# Patient Record
Sex: Female | Born: 1985 | Race: White | Hispanic: No | Marital: Married | Smoking: Never smoker
Health system: Southern US, Community
[De-identification: ages and names within clinical notes are randomized; demographics above are authoritative.]

## PROBLEM LIST (undated history)

## (undated) DIAGNOSIS — R519 Headache, unspecified: Secondary | ICD-10-CM

## (undated) DIAGNOSIS — R51 Headache: Secondary | ICD-10-CM

## (undated) DIAGNOSIS — F329 Major depressive disorder, single episode, unspecified: Secondary | ICD-10-CM

## (undated) DIAGNOSIS — F419 Anxiety disorder, unspecified: Secondary | ICD-10-CM

## (undated) DIAGNOSIS — T4145XA Adverse effect of unspecified anesthetic, initial encounter: Secondary | ICD-10-CM

## (undated) DIAGNOSIS — T8859XA Other complications of anesthesia, initial encounter: Secondary | ICD-10-CM

## (undated) DIAGNOSIS — F32A Depression, unspecified: Secondary | ICD-10-CM

## (undated) HISTORY — PX: WISDOM TOOTH EXTRACTION: SHX21

---

## 2009-05-15 HISTORY — PX: APPENDECTOMY: SHX54

## 2013-06-25 ENCOUNTER — Encounter (HOSPITAL_COMMUNITY): Payer: Self-pay | Admitting: *Deleted

## 2013-06-25 ENCOUNTER — Inpatient Hospital Stay (HOSPITAL_COMMUNITY)
Admission: AD | Admit: 2013-06-25 | Discharge: 2013-06-28 | DRG: 759 | Disposition: A | Discharge status: Home / Self Care | Payer: BC Managed Care – PPO | Source: Ambulatory Visit | Attending: Obstetrics and Gynecology | Admitting: Obstetrics and Gynecology

## 2013-06-25 ENCOUNTER — Other Ambulatory Visit: Payer: Self-pay | Admitting: Obstetrics and Gynecology

## 2013-06-25 ENCOUNTER — Encounter (HOSPITAL_COMMUNITY): Payer: Self-pay | Admitting: Anesthesiology

## 2013-06-25 ENCOUNTER — Ambulatory Visit (HOSPITAL_COMMUNITY)
Admission: RE | Admit: 2013-06-25 | Discharge: 2013-06-25 | Disposition: A | Discharge status: Home / Self Care | Payer: BC Managed Care – PPO | Source: Ambulatory Visit | Attending: Obstetrics and Gynecology | Admitting: Obstetrics and Gynecology

## 2013-06-25 DIAGNOSIS — R112 Nausea with vomiting, unspecified: Secondary | ICD-10-CM | POA: Diagnosis present

## 2013-06-25 DIAGNOSIS — N83519 Torsion of ovary and ovarian pedicle, unspecified side: Secondary | ICD-10-CM

## 2013-06-25 DIAGNOSIS — R1032 Left lower quadrant pain: Secondary | ICD-10-CM | POA: Diagnosis present

## 2013-06-25 DIAGNOSIS — N839 Noninflammatory disorder of ovary, fallopian tube and broad ligament, unspecified: Secondary | ICD-10-CM | POA: Diagnosis present

## 2013-06-25 DIAGNOSIS — D7289 Other specified disorders of white blood cells: Secondary | ICD-10-CM | POA: Diagnosis present

## 2013-06-25 DIAGNOSIS — N7093 Salpingitis and oophoritis, unspecified: Principal | ICD-10-CM | POA: Diagnosis present

## 2013-06-25 HISTORY — DX: Major depressive disorder, single episode, unspecified: F32.9

## 2013-06-25 HISTORY — DX: Depression, unspecified: F32.A

## 2013-06-25 HISTORY — DX: Anxiety disorder, unspecified: F41.9

## 2013-06-25 MED ORDER — GENTAMICIN SULFATE 40 MG/ML IJ SOLN
Freq: Once | INTRAMUSCULAR | Status: AC
  Administered 2013-06-25: 23:00:00 via INTRAVENOUS
  Filled 2013-06-25: qty 4

## 2013-06-25 MED ORDER — GENTAMICIN SULFATE 40 MG/ML IJ SOLN: Freq: Three times a day (TID) | INTRAVENOUS | Status: DC

## 2013-06-25 MED ORDER — HYDROMORPHONE HCL PF 1 MG/ML IJ SOLN
1.0000 mg | INTRAMUSCULAR | Status: DC | PRN
  Administered 2013-06-25 – 2013-06-26 (×4): 1 mg via INTRAVENOUS
  Filled 2013-06-25 (×4): qty 1

## 2013-06-25 MED ORDER — ONDANSETRON HCL 4 MG/2ML IJ SOLN
4.0000 mg | Freq: Once | INTRAMUSCULAR | Status: AC
  Administered 2013-06-25: 4 mg via INTRAVENOUS
  Filled 2013-06-25: qty 2

## 2013-06-25 MED ORDER — GENTAMICIN SULFATE 40 MG/ML IJ SOLN
Freq: Three times a day (TID) | INTRAVENOUS | Status: DC
  Administered 2013-06-26 – 2013-06-28 (×7): via INTRAVENOUS
  Filled 2013-06-25 (×9): qty 4

## 2013-06-25 MED ORDER — CLINDAMYCIN PHOSPHATE 900 MG/50ML IV SOLN
900.0000 mg | Freq: Three times a day (TID) | INTRAVENOUS | Status: DC
  Filled 2013-06-25: qty 50

## 2013-06-25 MED ORDER — HYDROMORPHONE HCL PF 1 MG/ML IJ SOLN
2.0000 mg | Freq: Once | INTRAMUSCULAR | Status: AC
Start: 2013-06-25 — End: 2013-06-25
  Administered 2013-06-25: 2 mg via INTRAVENOUS
  Filled 2013-06-25: qty 2

## 2013-06-25 MED ORDER — LACTATED RINGERS IV SOLN
INTRAVENOUS | Status: DC
  Administered 2013-06-25 – 2013-06-27 (×3): via INTRAVENOUS

## 2013-06-25 MED ORDER — FLUOXETINE HCL 20 MG PO TABS
20.0000 mg | ORAL_TABLET | Freq: Every day | ORAL | Status: DC
  Administered 2013-06-26 – 2013-06-28 (×3): 20 mg via ORAL
  Filled 2013-06-25 (×4): qty 1

## 2013-06-25 MED ORDER — ONDANSETRON HCL 4 MG/2ML IJ SOLN: 4.0000 mg | Freq: Three times a day (TID) | INTRAMUSCULAR | Status: DC | PRN

## 2013-06-25 MED ORDER — LACTATED RINGERS IV SOLN
INTRAVENOUS | Status: DC
  Administered 2013-06-25 – 2013-06-26 (×2): via INTRAVENOUS

## 2013-06-25 MED ORDER — SODIUM CHLORIDE 0.9 % IV SOLN
1.0000 g | Freq: Four times a day (QID) | INTRAVENOUS | Status: DC
Start: 2013-06-26 — End: 2013-06-28
  Administered 2013-06-25 – 2013-06-28 (×10): 1 g via INTRAVENOUS
  Filled 2013-06-25 (×13): qty 1000

## 2013-06-25 NOTE — H&P (Signed)
Becky Williamson is a 28 y.o. female, sent over from the hospital d/t significant left lower pain with N/V.  U/S showed large 7 cm left hydrosalpinx and 5 cm left ovary with + blood flowe and normal right ovary.  No mention of hydrosalpinx on CT from January.  UPT was neg today.  Denies VB, recent fever, resp or GI c/o's, UTI s/s.   Patient Active Problem List   Diagnosis Date Noted  . Vitamin D deficiency 06/27/2013  . Obesity, unspecified 06/27/2013  . Migraine 06/27/2013  . Premenstrual dysphoric disorder 06/27/2013  . TOA (tubo-ovarian abscess) 06/25/2013      OB History   Grav Para Term Preterm Abortions TAB SAB Ect Mult Living                 Past Medical History  Diagnosis Date  . Anxiety   . Depression    Past Surgical History  Procedure Laterality Date  . Wisdom tooth extraction    . Appendectomy  2011   Family History: family history includes Heart disease in her father and mother; Hypertension in her father and mother. Social History:  reports that she has never smoked. She has never used smokeless tobacco. She reports that she does not drink alcohol or use illicit drugs.   ROS: see HPI above, all other systems are negative   No Known Allergies     Blood pressure 134/92, pulse 59, temperature 97.9 F (36.6 C), temperature source Oral, resp. rate 18, height '5\' 7"'  (1.702 m), weight 280 lb (127.007 kg), last menstrual period 06/09/2013, SpO2 100.00%.  Chest clear Heart RRR without murmur Abd guarding lower left abdomen Ext: WNL   Labs: UPT neg Quant < 2.0 Urinalysis neg  Lipase < 10 Amylase 25  WBC 21.0  RBC 5.10  HEMOGLOBIN 13.2  HEMATOCRIT 38.8  MCV 76.1  MCH 25.9  MCHC 34.0  RDW 16.2  PLATELET COUNT 373    GRANULOCYTE % 88  ABSOLUTE GRAN 19.1  LYMPH % 8  ABSOLUTE LYMPH 1.8  MONO % 4  ABSOLUTE MONO 1.0  EOS % 0  ABSOLUTE EOS 0.0  BASO % 0  ABSOLUTE BASO 0.0  SMEAR REVIEW Criteria for review not met  SODIUM 138  POTASSIUM 4.0   CHLORIDE 103  CO2 23  GLUCOSE 101  BUN 11  CREATININE 0.76  BILIRUBIN, TOTAL 0.3  ALKALINE PHOSPHATASE 71 AST/SGOT 15  ALT/SGPT 15  TOTAL PROTEIN 7.5  ALBUMIN 4.2  CALCIUM 9.6    Assessment/Plan: Possible TOA Left lower abdominal pain with N/V  Admit to Med/Surg per c/w Dr. Cletis Media Abx therapy - Clindamycin, Gentamycin, Ampicillin Dilaudid for pain management Zofran for nausea Repeat CBC with diff in the am   Emilee Hero, MSN 06/27/2013, 11:29 AM

## 2013-06-25 NOTE — MAU Note (Signed)
Patient states she started having left lower abdominal pain about mid morning, has gotten worse. Was seen in the office and sent for an outpatient ultrasound then to MAU for further evaluation. Patient is vomiting on arrival to room in MAU. Denies bleeding or vaginal discharge.

## 2013-06-25 NOTE — Progress Notes (Addendum)
ANTIBIOTIC CONSULT NOTE - INITIAL  Pharmacy Consult for Gentamicin Indication: TOA   No Known Allergies  Patient Measurements: Height: 5\' 7"  (170.2 cm) Weight: 280 lb (127.007 kg) IBW/kg (Calculated) : 61.6 Adjusted Body Weight: 81.2 kg  Vital Signs: Temp: 98.1 F (36.7 C) (02/11 1844) Temp src: Oral (02/11 1844) BP: 139/88 mmHg (02/11 1942) Pulse Rate: 108 (02/11 1942)  Labs: No results found for this basename: WBC, HGB, PLT, LABCREA, CREATININE, CRCLEARANCE,  in the last 72 hours No results found for this basename: GENTTROUGH, GENTPEAK, GENTRANDOM,  in the last 72 hours   Microbiology: No results found for this or any previous visit (from the past 720 hour(s)).  Medications:  Clindamycin 900 mg IV q8h, ampicillin 2g IV q6h  Assessment: 28 y.o. female sent to MAU after US today revealed possible TOA. Estimated Ke = 0.355, Vd = 24.4L  Goal of Therapy:  Gentamicin peak 6-8 mg/L and Trough < 1 mg/L  Plan:   Gentamicin 160 mg IV q8h  Check Scr with next labs if gentamicin continued. Consider gentamicin trough if treatment continues >72h, or clinically indicated.  Drusilla KannerGrimsley, Casey Fye Lydia 06/25/2013,9:54 PM

## 2013-06-26 LAB — CBC WITH DIFFERENTIAL/PLATELET
Basophils Absolute: 0 10*3/uL (ref 0.0–0.1)
Basophils Relative: 0 % (ref 0–1)
Eosinophils Absolute: 0 10*3/uL (ref 0.0–0.7)
Eosinophils Relative: 0 % (ref 0–5)
HCT: 35.8 % — ABNORMAL LOW (ref 36.0–46.0)
HEMOGLOBIN: 11.8 g/dL — AB (ref 12.0–15.0)
LYMPHS ABS: 2.8 10*3/uL (ref 0.7–4.0)
LYMPHS PCT: 22 % (ref 12–46)
MCH: 25.8 pg — ABNORMAL LOW (ref 26.0–34.0)
MCHC: 33 g/dL (ref 30.0–36.0)
MCV: 78.2 fL (ref 78.0–100.0)
MONOS PCT: 8 % (ref 3–12)
Monocytes Absolute: 1 10*3/uL (ref 0.1–1.0)
NEUTROS PCT: 70 % (ref 43–77)
Neutro Abs: 8.9 10*3/uL — ABNORMAL HIGH (ref 1.7–7.7)
PLATELETS: 303 10*3/uL (ref 150–400)
RBC: 4.58 MIL/uL (ref 3.87–5.11)
RDW: 15.2 % (ref 11.5–15.5)
WBC: 12.8 10*3/uL — AB (ref 4.0–10.5)

## 2013-06-26 LAB — CREATININE, SERUM
CREATININE: 0.73 mg/dL (ref 0.50–1.10)
GFR calc Af Amer: >90 mL/min (ref >90)

## 2013-06-26 MED ORDER — KETOROLAC TROMETHAMINE 30 MG/ML IJ SOLN
30.0000 mg | Freq: Four times a day (QID) | INTRAMUSCULAR | Status: AC
  Administered 2013-06-26 (×3): 30 mg via INTRAVENOUS
  Filled 2013-06-26 (×3): qty 1

## 2013-06-26 MED ORDER — MORPHINE SULFATE 4 MG/ML IJ SOLN
1.0000 mg | INTRAMUSCULAR | Status: DC | PRN
  Administered 2013-06-26: 4 mg via INTRAVENOUS
  Administered 2013-06-26 (×2): 1 mg via INTRAVENOUS
  Administered 2013-06-27: 2 mg via INTRAVENOUS
  Filled 2013-06-26 (×4): qty 1

## 2013-06-26 NOTE — Progress Notes (Signed)
Subjective: Patient reports no problems voiding.  Says pain medicine is not really adequate.  Still has pain but much better than yesterday.  Only feels nausea when pain gets bad.    Objective: I have reviewed patient's vital signs.  General: alert and no distress Resp: clear to auscultation bilaterally Cardio: regular rate and rhythm GI: soft, + LLQ tenderness with deep palpation, no rebound or guarding Extremities: extremities normal, atraumatic, no cyanosis or edema, no calf tenderness no CVAT   Assessment/Plan: 27yo with TOA - repeat u/s in 6-8 wks and upon d/c abx for total of 2wks Cont A/G/C Pt afebrile GC/CT not sent on admission - will send dirty urine specimen now although has been getting antibiotics D/C dilaudid Add toradol and start morphine prn Possible d/c tomorrow if continue to improve    LOS: 1 day    Becky Williamson 06/26/2013, 1:46 PM

## 2013-06-27 DIAGNOSIS — E669 Obesity, unspecified: Secondary | ICD-10-CM | POA: Insufficient documentation

## 2013-06-27 DIAGNOSIS — F3281 Premenstrual dysphoric disorder: Secondary | ICD-10-CM | POA: Insufficient documentation

## 2013-06-27 DIAGNOSIS — E559 Vitamin D deficiency, unspecified: Secondary | ICD-10-CM | POA: Insufficient documentation

## 2013-06-27 DIAGNOSIS — G43909 Migraine, unspecified, not intractable, without status migrainosus: Secondary | ICD-10-CM | POA: Insufficient documentation

## 2013-06-27 LAB — GC/CHLAMYDIA PROBE AMP
CT PROBE, AMP APTIMA: NEGATIVE
GC PROBE AMP APTIMA: NEGATIVE

## 2013-06-27 MED ORDER — OXYCODONE-ACETAMINOPHEN 5-325 MG PO TABS
1.0000 | ORAL_TABLET | ORAL | Status: DC | PRN
  Administered 2013-06-27: 1 via ORAL
  Filled 2013-06-27: qty 1

## 2013-06-27 MED ORDER — DOCUSATE SODIUM 100 MG PO CAPS
100.0000 mg | ORAL_CAPSULE | Freq: Three times a day (TID) | ORAL | Status: DC
  Administered 2013-06-27 – 2013-06-28 (×4): 100 mg via ORAL
  Filled 2013-06-27 (×4): qty 1

## 2013-06-27 MED ORDER — IBUPROFEN 600 MG PO TABS
600.0000 mg | ORAL_TABLET | Freq: Four times a day (QID) | ORAL | Status: DC
  Administered 2013-06-27 – 2013-06-28 (×5): 600 mg via ORAL
  Filled 2013-06-27 (×5): qty 1

## 2013-06-27 MED ORDER — HYDROMORPHONE HCL 2 MG PO TABS
2.0000 mg | ORAL_TABLET | ORAL | Status: DC | PRN
Start: 2013-06-27 — End: 2013-06-28

## 2013-06-27 MED ORDER — FAMOTIDINE 20 MG PO TABS
ORAL_TABLET | ORAL | Status: AC
  Filled 2013-06-27: qty 1

## 2013-06-27 NOTE — Progress Notes (Signed)
Becky HarmsMegan Williamson is a27 y.o.  [id number]  Hospital Day: #2  Subjective: Patient reports  feeling better but has pain with ambulation.  Currently rates pain as 3/10. Patient has tolerated liquids, is voiding without difficulty and passed flatus.  Objective: Vital signs in last 24 hours: Temp:  [97.9 F (36.6 C)-98.2 F (36.8 C)] 97.9 F (36.6 C) (02/13 0600) Pulse Rate:  [55-107] 59 (02/13 0600) Resp:  [14-18] 18 (02/13 0600) BP: (129-157)/(76-93) 134/92 mmHg (02/13 0600) SpO2:  [95 %-100 %] 100 % (02/13 0600)  Intake/Output from previous day: 02/12 0701 - 02/13 0700 In: 4138.2 [P.O.:462; I.V.:3626.2] Out: 2550 [Urine:2550] Intake/Output this shift:    Recent Labs Lab 06/26/13 0510  WBC 12.8*  HGB 11.8*  HCT 35.8*  PLT 303     Recent Labs Lab 06/26/13 0510  CREATININE 0.73    EXAM: General: alert, no distress and moderately obese Resp: clear to auscultation bilaterally Cardio: regular rate and rhythm, S1, S2 normal, no murmur, click, rub or gallop GI: Bowel sounds present, soft with tenderness and voluntary guarding with deep palpation-no rebound. Extremities: SCD hose in place and no calf tenderness.   Assessment: s/p : stable and progressing well Tubo-ovarian Abscess (Left)  Plan: Advance diet Encourage ambulation Advance to PO medication  LOS: 2 days    Bernestine Holsapple, PA-C 06/27/2013 7:50 AM

## 2013-06-27 NOTE — Progress Notes (Signed)
Pt seen and examined BP 134/92  Pulse 59  Temp(Src) 97.9 F (36.6 C) (Oral)  Resp 18  Ht 5\' 7"  (1.702 m)  Wt 280 lb (127.007 kg)  BMI 43.84 kg/m2  SpO2 100%  LMP 06/09/2013 Physical Examination: General appearance - alert, well appearing, and in no distress Chest - clear to auscultation, no wheezes, rales or rhonchi, symmetric air entry Heart - normal rate and regular rhythm Abdomen - soft, nontender, nondistended, no masses or organomegaly Extremities - SCDS B If pt tolerates PO pain meds and diet she can be discharged to follow up in the office

## 2013-06-28 MED ORDER — IBUPROFEN 600 MG PO TABS: 600.0000 mg | ORAL_TABLET | Freq: Four times a day (QID) | ORAL | Status: DC

## 2013-06-28 MED ORDER — DOXYCYCLINE HYCLATE 50 MG PO CAPS: 100.0000 mg | ORAL_CAPSULE | Freq: Two times a day (BID) | ORAL | Status: AC

## 2013-06-28 MED ORDER — DSS 100 MG PO CAPS: 100.0000 mg | ORAL_CAPSULE | Freq: Three times a day (TID) | ORAL | Status: DC

## 2013-06-28 MED ORDER — OXYCODONE-ACETAMINOPHEN 5-325 MG PO TABS: 1.0000 | ORAL_TABLET | ORAL | Status: DC | PRN

## 2013-06-28 NOTE — Discharge Instructions (Signed)

## 2013-06-28 NOTE — Progress Notes (Signed)
Discharge instructions reviewed with patient.  Patient states understanding of home care, medications, activity, signs/symptoms to report to MD and return MD office visit.  No home equipment needed.  Patient ambulated for discharge in stable condition with staff without incident.  

## 2013-06-28 NOTE — Discharge Summary (Signed)
Physician Discharge Summary  Patient ID: Becky Williamson MRN: [id number] DOB/AGE: 1986-01-02 28 y.o.  Admit date: 06/25/2013 Discharge date: 06/28/2013  Admission Diagnoses: TOA abdominal pain, n/v  Discharge Diagnoses: TOA Active Problems:   TOA (tubo-ovarian abscess)   Discharged Condition: good  Hospital Course: Pt presented with n/v and elevated WBC and 5 cm ovarian mass.  She was given IVF, abx and pain medicine.  Her pain improved and her WBC normalized.  She was able to tolerate a regular diet.    Consults: None  Significant Diagnostic Studies: see labs  Treatments: IV hydration, antibiotics: gentamycin and ampicillin and clindamcin and analgesia: acetaminophen w/ codeine and Dilaudid  Discharge Exam: Blood pressure 144/87, pulse 79, temperature 98 F (36.7 C), temperature source Oral, resp. rate 18, height 5\' 7"  (1.702 m), weight 280 lb (127.007 kg), last menstrual period 06/09/2013, SpO2 100.00%. General appearance: alert and cooperative Chest wall: no tenderness Cardio: regular rate and rhythm GI: soft, non-tender; bowel sounds normal; no masses,  no organomegaly Extremities: Homans sign is negative, no sign of DVT  Disposition: Final discharge disposition not confirmed  Discharge Orders   Future Orders Complete By Expires   Call MD for:  persistant dizziness or light-headedness  As directed    Call MD for:  severe uncontrolled pain  As directed    Call MD for:  temperature >100.4  As directed    Diet general  As directed    Discharge instructions  As directed    Comments:     Call for vaginal bleeding if you soak greater than pad per hour   Increase activity slowly  As directed    May shower / Bathe  As directed    Sexual Activity Restrictions  As directed    Comments:     Pelvic rest.  Nothing in the vagina       Medication List    STOP taking these medications       bismuth subsalicylate 262 MG/15ML suspension  Commonly known as:  PEPTO BISMOL     EXCEDRIN PO      TAKE these medications       doxycycline 50 MG capsule  Commonly known as:  VIBRAMYCIN  Take 2 capsules (100 mg total) by mouth 2 (two) times daily.     DSS 100 MG Caps  Take 100 mg by mouth 3 (three) times daily.     ergocalciferol 50000 UNITS capsule  Commonly known as:  VITAMIN D2  Take 50,000 Units by mouth once a week.     FLUoxetine 20 MG tablet  Commonly known as:  PROZAC  Take 20 mg by mouth daily.     ibuprofen 600 MG tablet  Commonly known as:  ADVIL,MOTRIN  Take 1 tablet (600 mg total) by mouth every 6 (six) hours.     oxyCODONE-acetaminophen 5-325 MG per tablet  Commonly known as:  PERCOCET/ROXICET  Take 1-2 tablets by mouth every 4 (four) hours as needed for moderate pain.           Follow-up Information   Follow up with Purcell NailsOBERTS,ANGELA Y, MD. Schedule an appointment as soon as possible for a visit in 6 weeks.   Specialty:  Obstetrics and Gynecology   Contact information:   [address] [place] [address] [phone]       Signed: Jaymes GraffDILLARD,Mouhamad Teed A 06/28/2013, 12:54 PM

## 2014-09-22 ENCOUNTER — Ambulatory Visit: Payer: Self-pay | Admitting: Physician Assistant

## 2014-09-29 ENCOUNTER — Encounter (HOSPITAL_COMMUNITY): Payer: Self-pay | Admitting: General Practice

## 2014-10-02 ENCOUNTER — Other Ambulatory Visit: Payer: Self-pay | Admitting: Obstetrics and Gynecology

## 2014-10-13 ENCOUNTER — Other Ambulatory Visit (HOSPITAL_COMMUNITY): Payer: Self-pay | Admitting: Obstetrics and Gynecology

## 2014-10-13 NOTE — H&P (Signed)
29 yo female G0 presents for  laparoscopy because of pelvic pain and for  evacuation of  a left hydrosalpinx.   The patient was hospitalized a year ago because of a tuba-ovarian abscess.  At the time of hospitalization the left tube had enlarged to 7 cm.  Four Serial ultrasounds were performed with the most recent being May 2016: uterus measured: 5.25 x 3.65 x 3.60 cm, left ovary:  4.48 x 3.83 x 2.86 cm and right ovary: 3.23 x 2.65 x 1.7 cm with a persistent left hydrosalpinx: 1.5 x 3.4 x 4.9 cm.  Since last year the patient has had minimal discomfort from her left hydrosalpinx however, in recent months she will experience debilitating pain for one week when she ovulates. Menstrual flow is monthly, lasting 5-7 days with the need to change a menstrual cup every 12 hours.  Her menstrual cramping, accompanied by nausea a dizziness,  is rated as a 10/10 on a 10 point pain scale but is relieved with Ibuprofen 400 mg and activity.  She denies any changes in bowel or bladder function but admits to some discomfort with intercourse.  A review of both medical, surgical and expectant management options were given to the patient for her condition, however,  she desires to have both of her tubes removed since she and her spouse do not plan to have children.  Advised patient that 50% of patients who decide on tubal sterilization before age 110 regret the decision, however, she is firm.  Past Medical History  OB History: G: 0  GYN History: menarche: 29 YO    LMP: 09/27/2014    Contracepton condoms  The patient denies history of sexually transmitted disease.  Denies history of abnormal PAP smear  Last PAP smear: 2015  Medical History: Anxiety, PMDD,  Vitamin D Deficiency and Migraine  Surgical History: 2011  Appendectomy Denies problems with anesthesia or history of blood transfusions  Family History: Heart Disease, Hypertension,  Stroke and Blood Clots  Social History: Married  and denies tobacco use and occasionally  uses alcohol  Medications:  Fluoxetine 20 mg  daily Ibuprofen 600 mg  every 6 hours prn   No Known Allergies  ROS: Admits to glasses/contact lenses;  Denies headache, vision changes, nasal congestion, dysphagia, tinnitus, dizziness, hoarseness, cough,  chest pain, shortness of breath, nausea, vomiting, diarrhea,constipation,  urinary frequency, urgency  dysuria, hematuria, vaginitis symptoms,  swelling of joints,easy bruising,  myalgias, arthralgias, skin rashes, unexplained weight loss and except as is mentioned in the history of present illness, patient's review of systems is otherwise negative.  Physical Exam  Bp: 142/80    P: 80  R: 20  Temperature: 98.5 degrees F orally  Weight: 200.5 lbs.  Height:   BMI: 46.9  Neck: supple without masses or thyromegaly Lungs: clear to auscultation Heart: regular rate and rhythm Abdomen: soft, non-tender and no organomegaly Pelvic:EGBUS- wnl; vagina-normal rugae; uterus-normal size, cervix without lesions or motion tenderness; adnexae-no tenderness or masses Extremities:  no clubbing, cyanosis or edema   Assesment: Pelvic Pain            Left Hydrosalpinx   Disposition:  A discussion was held with patient regarding the indication for her procedure(s) along with the risks, which include but are not limited to: reaction to anesthesia, damage to adjacent organs, infection and excessive bleeding. The patient verbalized understanding of these risk and has consented to proceed with Laparoscopic Left Salpingectomy with Possible Bilateral Salpingectomy at [facility] [place] on November 05, 2014.   CSN# [id number]   Ashaki Frosch J. Lowell GuitarPowell, PA-C  for Dr. Woodroe ModeAngela Y. Su Hiltoberts

## 2014-11-04 MED ORDER — DEXTROSE 5 % IV SOLN
3.0000 g | INTRAVENOUS | Status: AC
  Administered 2014-11-05: 3 g via INTRAVENOUS
  Filled 2014-11-04: qty 3000

## 2014-11-05 ENCOUNTER — Ambulatory Visit (HOSPITAL_COMMUNITY): Payer: BLUE CROSS/BLUE SHIELD | Admitting: Anesthesiology

## 2014-11-05 ENCOUNTER — Encounter (HOSPITAL_COMMUNITY)
Admission: RE | Disposition: A | Discharge status: Home / Self Care | Payer: Self-pay | Source: Ambulatory Visit | Attending: Obstetrics and Gynecology

## 2014-11-05 ENCOUNTER — Ambulatory Visit (HOSPITAL_COMMUNITY)
Admission: RE | Admit: 2014-11-05 | Discharge: 2014-11-05 | Disposition: A | Discharge status: Home / Self Care | Payer: BLUE CROSS/BLUE SHIELD | Source: Ambulatory Visit | Attending: Obstetrics and Gynecology | Admitting: Obstetrics and Gynecology

## 2014-11-05 ENCOUNTER — Encounter (HOSPITAL_COMMUNITY): Payer: Self-pay | Admitting: *Deleted

## 2014-11-05 DIAGNOSIS — R102 Pelvic and perineal pain: Secondary | ICD-10-CM | POA: Diagnosis not present

## 2014-11-05 DIAGNOSIS — E559 Vitamin D deficiency, unspecified: Secondary | ICD-10-CM | POA: Insufficient documentation

## 2014-11-05 DIAGNOSIS — N943 Premenstrual tension syndrome: Secondary | ICD-10-CM | POA: Insufficient documentation

## 2014-11-05 DIAGNOSIS — N7011 Chronic salpingitis: Secondary | ICD-10-CM

## 2014-11-05 DIAGNOSIS — Z6841 Body Mass Index (BMI) 40.0 and over, adult: Secondary | ICD-10-CM | POA: Diagnosis not present

## 2014-11-05 DIAGNOSIS — N831 Corpus luteum cyst: Secondary | ICD-10-CM | POA: Insufficient documentation

## 2014-11-05 DIAGNOSIS — G43909 Migraine, unspecified, not intractable, without status migrainosus: Secondary | ICD-10-CM | POA: Diagnosis not present

## 2014-11-05 DIAGNOSIS — F419 Anxiety disorder, unspecified: Secondary | ICD-10-CM | POA: Diagnosis not present

## 2014-11-05 DIAGNOSIS — N7093 Salpingitis and oophoritis, unspecified: Secondary | ICD-10-CM

## 2014-11-05 HISTORY — PX: LAPAROSCOPIC BILATERAL SALPINGECTOMY: SHX5889

## 2014-11-05 LAB — PREGNANCY, URINE: PREG TEST UR: NEGATIVE

## 2014-11-05 LAB — CBC
HCT: 37.8 % (ref 36.0–46.0)
Hemoglobin: 12.8 g/dL (ref 12.0–15.0)
MCH: 26.6 pg (ref 26.0–34.0)
MCHC: 33.9 g/dL (ref 30.0–36.0)
MCV: 78.6 fL (ref 78.0–100.0)
PLATELETS: 332 10*3/uL (ref 150–400)
RBC: 4.81 MIL/uL (ref 3.87–5.11)
RDW: 15.7 % — AB (ref 11.5–15.5)
WBC: 15.9 10*3/uL — ABNORMAL HIGH (ref 4.0–10.5)

## 2014-11-05 SURGERY — SALPINGECTOMY, BILATERAL, LAPAROSCOPIC
Anesthesia: General | Site: Abdomen | Laterality: Bilateral

## 2014-11-05 MED ORDER — BUPIVACAINE HCL (PF) 0.25 % IJ SOLN
INTRAMUSCULAR | Status: AC
  Filled 2014-11-05: qty 30

## 2014-11-05 MED ORDER — KETOROLAC TROMETHAMINE 30 MG/ML IJ SOLN
30.0000 mg | Freq: Once | INTRAMUSCULAR | Status: AC | PRN
  Administered 2014-11-05: 30 mg via INTRAVENOUS

## 2014-11-05 MED ORDER — FENTANYL CITRATE (PF) 250 MCG/5ML IJ SOLN
INTRAMUSCULAR | Status: AC
  Filled 2014-11-05: qty 5

## 2014-11-05 MED ORDER — FENTANYL CITRATE (PF) 100 MCG/2ML IJ SOLN
INTRAMUSCULAR | Status: AC
  Administered 2014-11-05: 25 ug via INTRAVENOUS
  Filled 2014-11-05: qty 2

## 2014-11-05 MED ORDER — HYDROMORPHONE HCL 1 MG/ML IJ SOLN
INTRAMUSCULAR | Status: AC
  Filled 2014-11-05: qty 1

## 2014-11-05 MED ORDER — PROPOFOL 10 MG/ML IV BOLUS
INTRAVENOUS | Status: DC | PRN
  Administered 2014-11-05: 50 mg via INTRAVENOUS
  Administered 2014-11-05: 150 mg via INTRAVENOUS

## 2014-11-05 MED ORDER — MEPERIDINE HCL 25 MG/ML IJ SOLN: 6.2500 mg | INTRAMUSCULAR | Status: DC | PRN

## 2014-11-05 MED ORDER — ONDANSETRON HCL 4 MG/2ML IJ SOLN
4.0000 mg | Freq: Once | INTRAMUSCULAR | Status: DC | PRN
Start: 2014-11-05 — End: 2014-11-05

## 2014-11-05 MED ORDER — BUPIVACAINE HCL (PF) 0.25 % IJ SOLN
INTRAMUSCULAR | Status: DC | PRN
  Administered 2014-11-05: 20 mL

## 2014-11-05 MED ORDER — SCOPOLAMINE 1 MG/3DAYS TD PT72
1.0000 | MEDICATED_PATCH | Freq: Once | TRANSDERMAL | Status: DC
  Administered 2014-11-05: 1.5 mg via TRANSDERMAL

## 2014-11-05 MED ORDER — ROCURONIUM BROMIDE 100 MG/10ML IV SOLN
INTRAVENOUS | Status: DC | PRN
  Administered 2014-11-05: 20 mg via INTRAVENOUS
  Administered 2014-11-05 (×2): 10 mg via INTRAVENOUS
  Administered 2014-11-05: 50 mg via INTRAVENOUS
  Administered 2014-11-05: 10 mg via INTRAVENOUS

## 2014-11-05 MED ORDER — KETOROLAC TROMETHAMINE 30 MG/ML IJ SOLN
INTRAMUSCULAR | Status: AC
  Administered 2014-11-05: 30 mg via INTRAVENOUS
  Filled 2014-11-05: qty 1

## 2014-11-05 MED ORDER — MIDAZOLAM HCL 2 MG/2ML IJ SOLN
INTRAMUSCULAR | Status: AC
  Filled 2014-11-05: qty 2

## 2014-11-05 MED ORDER — LIDOCAINE HCL (CARDIAC) 20 MG/ML IV SOLN
INTRAVENOUS | Status: DC | PRN
  Administered 2014-11-05: 60 mg via INTRAVENOUS

## 2014-11-05 MED ORDER — LIDOCAINE HCL (CARDIAC) 20 MG/ML IV SOLN
INTRAVENOUS | Status: AC
  Filled 2014-11-05: qty 5

## 2014-11-05 MED ORDER — OXYCODONE-ACETAMINOPHEN 5-325 MG PO TABS: 1.0000 | ORAL_TABLET | Freq: Four times a day (QID) | ORAL | Status: DC | PRN

## 2014-11-05 MED ORDER — ONDANSETRON HCL 4 MG/2ML IJ SOLN
INTRAMUSCULAR | Status: DC | PRN
  Administered 2014-11-05: 4 mg via INTRAVENOUS

## 2014-11-05 MED ORDER — MIDAZOLAM HCL 5 MG/5ML IJ SOLN
INTRAMUSCULAR | Status: DC | PRN
  Administered 2014-11-05: 2 mg via INTRAVENOUS

## 2014-11-05 MED ORDER — IBUPROFEN 800 MG PO TABS: ORAL_TABLET | ORAL | Status: DC

## 2014-11-05 MED ORDER — GLYCOPYRROLATE 0.2 MG/ML IJ SOLN
INTRAMUSCULAR | Status: AC
  Filled 2014-11-05: qty 4

## 2014-11-05 MED ORDER — NEOSTIGMINE METHYLSULFATE 10 MG/10ML IV SOLN
INTRAVENOUS | Status: DC | PRN
  Administered 2014-11-05: 5 mg via INTRAVENOUS

## 2014-11-05 MED ORDER — LACTATED RINGERS IV SOLN
INTRAVENOUS | Status: DC
  Administered 2014-11-05 (×2): via INTRAVENOUS

## 2014-11-05 MED ORDER — MIDAZOLAM HCL 5 MG/ML IJ SOLN: 0.5000 mg | Freq: Once | INTRAMUSCULAR | Status: DC

## 2014-11-05 MED ORDER — DEXAMETHASONE SODIUM PHOSPHATE 10 MG/ML IJ SOLN
INTRAMUSCULAR | Status: AC
  Filled 2014-11-05: qty 1

## 2014-11-05 MED ORDER — HYDROMORPHONE HCL 1 MG/ML IJ SOLN
INTRAMUSCULAR | Status: DC | PRN
  Administered 2014-11-05: 1 mg via INTRAVENOUS

## 2014-11-05 MED ORDER — FENTANYL CITRATE (PF) 100 MCG/2ML IJ SOLN
INTRAMUSCULAR | Status: DC | PRN
  Administered 2014-11-05: 50 ug via INTRAVENOUS
  Administered 2014-11-05 (×2): 100 ug via INTRAVENOUS
  Administered 2014-11-05: 50 ug via INTRAVENOUS
  Administered 2014-11-05: 100 ug via INTRAVENOUS
  Administered 2014-11-05 (×2): 50 ug via INTRAVENOUS

## 2014-11-05 MED ORDER — ONDANSETRON HCL 4 MG/2ML IJ SOLN
INTRAMUSCULAR | Status: AC
  Filled 2014-11-05: qty 2

## 2014-11-05 MED ORDER — PROPOFOL 10 MG/ML IV BOLUS
INTRAVENOUS | Status: AC
  Filled 2014-11-05: qty 20

## 2014-11-05 MED ORDER — SCOPOLAMINE 1 MG/3DAYS TD PT72
MEDICATED_PATCH | TRANSDERMAL | Status: AC
  Filled 2014-11-05: qty 1

## 2014-11-05 MED ORDER — MIDAZOLAM HCL 2 MG/2ML IJ SOLN
INTRAMUSCULAR | Status: AC
  Administered 2014-11-05: 0.5 mg
  Filled 2014-11-05: qty 2

## 2014-11-05 MED ORDER — GLYCOPYRROLATE 0.2 MG/ML IJ SOLN
INTRAMUSCULAR | Status: DC | PRN
  Administered 2014-11-05: .8 mg via INTRAVENOUS

## 2014-11-05 MED ORDER — NEOSTIGMINE METHYLSULFATE 10 MG/10ML IV SOLN
INTRAVENOUS | Status: AC
  Filled 2014-11-05: qty 1

## 2014-11-05 MED ORDER — HEPARIN SODIUM (PORCINE) 5000 UNIT/ML IJ SOLN
INTRAMUSCULAR | Status: AC
  Filled 2014-11-05: qty 1

## 2014-11-05 MED ORDER — DEXAMETHASONE SODIUM PHOSPHATE 10 MG/ML IJ SOLN
INTRAMUSCULAR | Status: DC | PRN
  Administered 2014-11-05: 10 mg via INTRAVENOUS

## 2014-11-05 MED ORDER — FENTANYL CITRATE (PF) 100 MCG/2ML IJ SOLN
25.0000 ug | INTRAMUSCULAR | Status: DC | PRN
  Administered 2014-11-05: 25 ug via INTRAVENOUS

## 2014-11-05 SURGICAL SUPPLY — 33 items
CABLE HIGH FREQUENCY MONO STRZ (ELECTRODE) IMPLANT
CHLORAPREP W/TINT 26ML (MISCELLANEOUS) ×4 IMPLANT
CLOTH BEACON ORANGE TIMEOUT ST (SAFETY) ×4 IMPLANT
DRSG COVADERM PLUS 2X2 (GAUZE/BANDAGES/DRESSINGS) ×4 IMPLANT
DRSG OPSITE POSTOP 3X4 (GAUZE/BANDAGES/DRESSINGS) ×4 IMPLANT
FORCEPS CUTTING 33CM 5MM (CUTTING FORCEPS) IMPLANT
FORCEPS CUTTING 45CM 5MM (CUTTING FORCEPS) IMPLANT
GLOVE BIO SURGEON STRL SZ7.5 (GLOVE) ×4 IMPLANT
GLOVE BIOGEL PI IND STRL 7.5 (GLOVE) ×2 IMPLANT
GLOVE BIOGEL PI INDICATOR 7.5 (GLOVE) ×2
GOWN STRL REUS W/TWL LRG LVL3 (GOWN DISPOSABLE) ×8 IMPLANT
LIQUID BAND (GAUZE/BANDAGES/DRESSINGS) ×4 IMPLANT
NEEDLE INSUFFLATION 120MM (ENDOMECHANICALS) ×4 IMPLANT
NS IRRIG 1000ML POUR BTL (IV SOLUTION) ×4 IMPLANT
PACK LAPAROSCOPY BASIN (CUSTOM PROCEDURE TRAY) ×4 IMPLANT
PAD POSITIONER PINK NONSTERILE (MISCELLANEOUS) ×4 IMPLANT
POUCH SPECIMEN RETRIEVAL 10MM (ENDOMECHANICALS) IMPLANT
PROTECTOR NERVE ULNAR (MISCELLANEOUS) ×4 IMPLANT
SET IRRIG TUBING LAPAROSCOPIC (IRRIGATION / IRRIGATOR) IMPLANT
SLEEVE XCEL OPT CAN 5 100 (ENDOMECHANICALS) ×4 IMPLANT
SOLUTION ELECTROLUBE (MISCELLANEOUS) IMPLANT
SUT MNCRL AB 3-0 PS2 27 (SUTURE) ×4 IMPLANT
SUT VICRYL 0 ENDOLOOP (SUTURE) IMPLANT
SUT VICRYL 0 TIES 12 18 (SUTURE) IMPLANT
SUT VICRYL 0 UR6 27IN ABS (SUTURE) ×4 IMPLANT
SYR 50ML LL SCALE MARK (SYRINGE) IMPLANT
TOWEL OR 17X24 6PK STRL BLUE (TOWEL DISPOSABLE) ×8 IMPLANT
TRAY FOLEY CATH SILVER 14FR (SET/KITS/TRAYS/PACK) ×4 IMPLANT
TROCAR BALLN 12MMX100 BLUNT (TROCAR) ×4 IMPLANT
TROCAR XCEL NON-BLD 11X100MML (ENDOMECHANICALS) ×8 IMPLANT
TROCAR XCEL NON-BLD 5MMX100MML (ENDOMECHANICALS) ×4 IMPLANT
WARMER LAPAROSCOPE (MISCELLANEOUS) ×4 IMPLANT
WATER STERILE IRR 1000ML POUR (IV SOLUTION) ×4 IMPLANT

## 2014-11-05 NOTE — Anesthesia Preprocedure Evaluation (Addendum)
Anesthesia Evaluation  Patient identified by MRN, date of birth, ID band Patient awake    Reviewed: Allergy & Precautions, H&P , NPO status , Patient's Chart, lab work & pertinent test results  Airway Mallampati: I  TM Distance: >3 FB Neck ROM: full    Dental no notable dental hx.    Pulmonary neg pulmonary ROS,    Pulmonary exam normal       Cardiovascular negative cardio ROS Normal cardiovascular exam    Neuro/Psych PSYCHIATRIC DISORDERS Anxiety Depression    GI/Hepatic negative GI ROS, Neg liver ROS,   Endo/Other  Morbid obesity  Renal/GU negative Renal ROS     Musculoskeletal   Abdominal (+) + obese,   Peds  Hematology negative hematology ROS (+)   Anesthesia Other Findings   Reproductive/Obstetrics                            Anesthesia Physical Anesthesia Plan  ASA: III  Anesthesia Plan: General   Post-op Pain Management:    Induction: Intravenous  Airway Management Planned: Oral ETT  Additional Equipment:   Intra-op Plan:   Post-operative Plan: Extubation in OR  Informed Consent: I have reviewed the patients History and Physical, chart, labs and discussed the procedure including the risks, benefits and alternatives for the proposed anesthesia with the patient or authorized representative who has indicated his/her understanding and acceptance.     Plan Discussed with: CRNA and Surgeon  Anesthesia Plan Comments:         Anesthesia Quick Evaluation

## 2014-11-05 NOTE — Op Note (Signed)
Preop Diagnosis: 1.Pelvic Pain 2.Left Hydrosalpinx  Postop Diagnosis: 1.Pelvic Pain 2.Left Hydrosalpinx  Procedure: LAPAROSCOPIC BILATERAL SALPINGECTOMY  Anesthesia: General   Attending: Purcell Nails, MD   Assistant: Henreitta Leber, PA-C  Findings: Normal appearing bilateral ovaries with competing bilateral corpus luteal cysts.  The left recently ovulated and left hydrosalpinx and normal appearing right tube.  Pathology: Bilateral tubes  Fluids: 1500 cc  UOP: 205 cc  EBL: 25 cc  Complications: None  Procedure: The patient was taken to the operating room after the risks, benefits, alternatives, complications, treatment options, and expected outcomes were discussed with the patient. The patient verbalized understanding, the patient concurred with the proposed plan and consent signed and witnessed. The patient was taken to the Operating Room, identified as Becky Williamson and procedure verified as laparoscopic bilateral salpingectomy.  A Time Out was held and the above information confirmed.  The patient was placed under general anesthesia per anesthesia staff, the patient was placed in modified dorsal lithotomy position and was prepped, draped, and catheterized in the normal, sterile fashion.  The cervix was visualized and an intrauterine manipulator was placed. A  10 mm umbilical incision was then performed. Veress needle was passed and pneumoperitoneum was established after initially attempting veress insertion and open laparoscopy with some difficulty secondary to depth of fascia.  A 10 mm trocar was advanced into the intraabdominal cavity, the laparoscope was introduced and findings as noted above.  Another 35mm incision was made suprapubically and 50mm trocar advanced into the intraabdominal cavity under direct visualization.  The same was done in the LLQ and RLQ with 64mm trocars.  The dilated left fallopian tube was identified and carried out to its fimbriated end and excised  with the Gyrus tripolar.  The same was done on the contralateral side.  The 64mm trocar and 55mm RLQ trocars were removed under direct visualization.  The fascial closure device was used to close the suprapubic port with 0 vicryl.  The LLQ 96mm trocar was removed under direct visualization and pneumoperitoneum released.  The laparoscope and 5mm umbilical trocar were removed under direct visualization.  The fascia was repaired with 0 vicryl via a running stitch and the skin was reapproximated with 3-0 monocryl via subcuticular stitch at both 37mm incisions.  Liquabond was applied to all incisions as well as button hole in skin at umbilicus.  Sponge, instrument, lap and needle counts were correct.  Patient tolerated the procedure well and was returned to the recovery room in good condition.

## 2014-11-05 NOTE — Interval H&P Note (Signed)
History and Physical Interval Note:  11/05/2014 1:02 PM  Becky Williamson  has presented today for surgery, with the diagnosis of Chronic Salpingitis - 1 HOUR  The various methods of treatment have been discussed with the patient and family. After consideration of risks, benefits and other options for treatment, the patient has consented to  Procedure(s): LAPAROSCOPIC LEFT SALPINGECTOMY POSSIBLE BILATERAL SALPINGECTOMY (Bilateral) as a surgical intervention .  The patient's history has been reviewed, patient examined, no change in status, stable for surgery.  I have reviewed the patient's chart and labs.  Questions were answered to the patient's satisfaction.     Purcell Nails

## 2014-11-05 NOTE — Anesthesia Procedure Notes (Signed)
Procedure Name: Intubation Date/Time: 11/05/2014 1:33 PM Performed by: Renford Dills Pre-anesthesia Checklist: Patient identified, Emergency Drugs available, Suction available and Patient being monitored Patient Re-evaluated:Patient Re-evaluated prior to inductionOxygen Delivery Method: Circle system utilized Preoxygenation: Pre-oxygenation with 100% oxygen Intubation Type: IV induction Ventilation: Mask ventilation without difficulty Laryngoscope Size: Miller and 2 Grade View: Grade I Tube size: 7.0 mm Number of attempts: 1 Airway Equipment and Method: Stylet Placement Confirmation: ETT inserted through vocal cords under direct vision,  positive ETCO2 and breath sounds checked- equal and bilateral Secured at: 20 cm Tube secured with: Tape Dental Injury: Teeth and Oropharynx as per pre-operative assessment

## 2014-11-05 NOTE — Anesthesia Postprocedure Evaluation (Signed)
Anesthesia Post Note  Patient: Becky Williamson  Procedure(s) Performed: Procedure(s) (LRB): LAPAROSCOPIC BILATERAL SALPINGECTOMY (Bilateral)  Anesthesia type: General  Patient location: PACU  Post pain: Pain level controlled  Post assessment: Post-op Vital signs reviewed  Last Vitals:  Filed Vitals:   11/05/14 1645  BP: 145/84  Pulse: 78  Temp:   Resp: 20    Post vital signs: Reviewed  Level of consciousness: sedated  Complications: No apparent anesthesia complications

## 2014-11-05 NOTE — Discharge Instructions (Addendum)
Call [facility] @ [phone] if:  You have a temperature greater than or equal to 100.4 degrees Farenheit orally You have pain that is not made better by the pain medication given and taken as directed You have excessive bleeding or problems urinating  Take Colace (Docusate Sodium/Stool Softener) 100 mg 2-3 times daily while taking narcotic pain medicine to avoid constipation or until bowel movements are regular. Take Ibuprofen 600 mg with food every 6 hours for 5 days then as needed for pain  You may drive after you are no longer taking narcotic pain medications You may walk up steps  You may shower tomorrow You may resume a regular diet  Keep incisions clean and dry Do not lift over 15 pounds for 6 weeks Avoid anything in vagina for 6 weeks (or until after your post-operative visit)     Salpingectomy Salpingectomy, also called tubectomy, is the surgical removal of one of the fallopian tubes. The fallopian tubes are tubes that are connected to the uterus. These tubes transport the egg from the ovary to the uterus. A salpingectomy may be done for various reasons, including:   A tubal (ectopic) pregnancy. This is especially true if the tube ruptures.  An infected fallopian tube.  The need to remove the fallopian tube when removing an ovary with a cyst or tumor.  The need to remove the fallopian tube when removing the uterus.  Cancer of the fallopian tube or nearby organs. Removing one fallopian tube does not prevent you from becoming pregnant. It also does not cause problems with your menstrual periods.  LET [facility] CARE PROVIDER KNOW ABOUT:  Any allergies you have.  All medicines you are taking, including vitamins, herbs, eye drops, creams, and over-the-counter medicines.  Previous problems you or members of your family have had with the use of anesthetics.  Any blood disorders you have.  Previous surgeries you have had.  Medical conditions you  have. RISKS AND COMPLICATIONS  Generally, this is a safe procedure. However, as with any procedure, complications can occur. Possible complications include:  Injury to surrounding organs.  Bleeding.  Infection.  Problems related to anesthesia. BEFORE THE PROCEDURE  Ask your health care provider about changing or stopping your regular medicines. You may need to stop taking certain medicines, such as aspirin or blood thinners, at least 1 week before the surgery.  Do not eat or drink anything for at least 8 hours before the surgery.  If you smoke, do not smoke for at least 2 weeks before the surgery.  Make plans to have someone drive you home after the procedure or after your hospital stay. Also arrange for someone to help you with activities during recovery. PROCEDURE   You will be given medicine to help you relax before the procedure (sedative). You will then be given medicine to make you sleep through the procedure (general anesthetic). These medicines will be given through an IV access tube that is put into one of your veins.  Once you are asleep, your lower abdomen will be shaved and cleaned. A thin, flexible tube (catheter) will be placed in your bladder.  The surgeon may use a laparoscopic, robotic, or open technique for this surgery:  In the laparoscopic technique, the surgery is done through two small cuts (incisions) in the abdomen. A thin, lighted tube with a tiny camera on the end (laparoscope) is inserted into one of the incisions. The tools needed for the procedure are put through the other incision.  A robotic technique may be chosen to perform complex surgery in a small space. In the robotic technique, small incisions will be made. A camera and surgical instruments are passed through the incisions. Surgical instruments will be controlled with the help of a robotic arm.  In the open technique, the surgery is done through one large incision in the abdomen.  Using any of  these techniques, the surgeon removes the fallopian tube from where it attaches to the uterus. The blood vessels will be clamped and tied.  The surgeon then uses staples or stitches to close the incision or incisions. AFTER THE PROCEDURE   You will be taken to a recovery area where your progress will be monitored for 1-3 hours.  If the laparoscopic technique was used, you may be allowed to go home after several hours. You may have some shoulder pain after the laparoscopic procedure. This is normal and usually goes away in a day or two.  If the open technique was used, you will be admitted to the hospital for a couple of days.  You will be given pain medicine if needed.  The IV access tube and catheter will be removed before you are discharged. Document Released: 09/17/2008 Document Revised: 02/19/2013 Document Reviewed: 10/23/2012  Post Anesthesia Home Care Instructions  No Ibuprofen products (ie Advil, Aleve, Motrin, etc) until after 10:30 pm tonight.  Activity: Get plenty of rest for the remainder of the day. A responsible adult should stay with you for 24 hours following the procedure.  For the next 24 hours, DO NOT: -Drive a car -Advertising copywriter -Drink alcoholic beverages -Take any medication unless instructed by your physician -Make any legal decisions or sign important papers.  Meals: Start with liquid foods such as gelatin or soup. Progress to regular foods as tolerated. Avoid greasy, spicy, heavy foods. If nausea and/or vomiting occur, drink only clear liquids until the nausea and/or vomiting subsides. Call your physician if vomiting continues.  Special Instructions/Symptoms: Your throat may feel dry or sore from the anesthesia or the breathing tube placed in your throat during surgery. If this causes discomfort, gargle with warm salt water. The discomfort should disappear within 24 hours.  If you had a scopolamine patch placed behind your ear for the management of post-  operative nausea and/or vomiting:  1. The medication in the patch is effective for 72 hours, after which it should be removed.  Wrap patch in a tissue and discard in the trash. Wash hands thoroughly with soap and water. 2. You may remove the patch earlier than 72 hours if you experience unpleasant side effects which may include dry mouth, dizziness or visual disturbances. 3. Avoid touching the patch. Wash your hands with soap and water after contact with the patch.   Exit[employer] Patient Information 2015 [place], [place]. This information is not intended to replace advice given to you by your health care provider. Make sure you discuss any questions you have with your health care provider.

## 2014-11-05 NOTE — H&P (View-Only) (Signed)
29 yo female G0 presents for  laparoscopy because of pelvic pain and for  evacuation of  a left hydrosalpinx.   The patient was hospitalized a year ago because of a tuba-ovarian abscess.  At the time of hospitalization the left tube had enlarged to 7 cm.  Four Serial ultrasounds were performed with the most recent being May 2016: uterus measured: 5.25 x 3.65 x 3.60 cm, left ovary:  4.48 x 3.83 x 2.86 cm and right ovary: 3.23 x 2.65 x 1.7 cm with a persistent left hydrosalpinx: 1.5 x 3.4 x 4.9 cm.  Since last year the patient has had minimal discomfort from her left hydrosalpinx however, in recent months she will experience debilitating pain for one week when she ovulates. Menstrual flow is monthly, lasting 5-7 days with the need to change a menstrual cup every 12 hours.  Her menstrual cramping, accompanied by nausea a dizziness,  is rated as a 10/10 on a 10 point pain scale but is relieved with Ibuprofen 400 mg and activity.  She denies any changes in bowel or bladder function but admits to some discomfort with intercourse.  A review of both medical, surgical and expectant management options were given to the patient for her condition, however,  she desires to have both of her tubes removed since she and her spouse do not plan to have children.  Advised patient that 50% of patients who decide on tubal sterilization before age 30 regret the decision, however, she is firm.  Past Medical History  OB History: G: 0  GYN History: menarche: 29 YO    LMP: 09/27/2014    Contracepton condoms  The patient denies history of sexually transmitted disease.  Denies history of abnormal PAP smear  Last PAP smear: 2015  Medical History: Anxiety, PMDD,  Vitamin D Deficiency and Migraine  Surgical History: 2011  Appendectomy Denies problems with anesthesia or history of blood transfusions  Family History: Heart Disease, Hypertension,  Stroke and Blood Clots  Social History: Married  and denies tobacco use and occasionally  uses alcohol  Medications:  Fluoxetine 20 mg  daily Ibuprofen 600 mg  every 6 hours prn   No Known Allergies  ROS: Admits to glasses/contact lenses;  Denies headache, vision changes, nasal congestion, dysphagia, tinnitus, dizziness, hoarseness, cough,  chest pain, shortness of breath, nausea, vomiting, diarrhea,constipation,  urinary frequency, urgency  dysuria, hematuria, vaginitis symptoms,  swelling of joints,easy bruising,  myalgias, arthralgias, skin rashes, unexplained weight loss and except as is mentioned in the history of present illness, patient's review of systems is otherwise negative.  Physical Exam  Bp: 142/80    P: 80  R: 20  Temperature: 98.5 degrees F orally  Weight: 200.5 lbs.  Height: 5' 7"  BMI: 46.9  Neck: supple without masses or thyromegaly Lungs: clear to auscultation Heart: regular rate and rhythm Abdomen: soft, non-tender and no organomegaly Pelvic:EGBUS- wnl; vagina-normal rugae; uterus-normal size, cervix without lesions or motion tenderness; adnexae-no tenderness or masses Extremities:  no clubbing, cyanosis or edema   Assesment: Pelvic Pain            Left Hydrosalpinx   Disposition:  A discussion was held with patient regarding the indication for her procedure(s) along with the risks, which include but are not limited to: reaction to anesthesia, damage to adjacent organs, infection and excessive bleeding. The patient verbalized understanding of these risk and has consented to proceed with Laparoscopic Left Salpingectomy with Possible Bilateral Salpingectomy at [facility] on November 05, 2014.   CSN# [id number]   Deneen Slager J. Francis Yardley, PA-C  for Dr. Angela Y. Roberts   

## 2014-11-05 NOTE — Transfer of Care (Signed)
Immediate Anesthesia Transfer of Care Note  Patient: Becky Williamson  Procedure(s) Performed: Procedure(s): LAPAROSCOPIC BILATERAL SALPINGECTOMY (Bilateral)  Patient Location: PACU  Anesthesia Type:General  Level of Consciousness: awake, alert  and oriented  Airway & Oxygen Therapy: Patient Spontanous Breathing and Patient connected to nasal cannula oxygen  Post-op Assessment: Report given to RN and Post -op Vital signs reviewed and stable  Post vital signs: Reviewed and stable  Last Vitals:  Filed Vitals:   11/05/14 1254  BP: 133/81  Pulse:   Temp:   Resp:     Complications: No apparent anesthesia complications

## 2014-11-07 ENCOUNTER — Encounter (HOSPITAL_COMMUNITY): Payer: Self-pay | Admitting: Obstetrics and Gynecology

## 2016-01-05 ENCOUNTER — Other Ambulatory Visit: Payer: Self-pay | Admitting: Obstetrics and Gynecology

## 2016-01-05 ENCOUNTER — Other Ambulatory Visit (HOSPITAL_COMMUNITY)
Admission: RE | Admit: 2016-01-05 | Discharge: 2016-01-05 | Disposition: A | Discharge status: Home / Self Care | Payer: BLUE CROSS/BLUE SHIELD | Source: Ambulatory Visit | Attending: Obstetrics and Gynecology | Admitting: Obstetrics and Gynecology

## 2016-01-05 DIAGNOSIS — Z01419 Encounter for gynecological examination (general) (routine) without abnormal findings: Secondary | ICD-10-CM | POA: Insufficient documentation

## 2016-01-07 LAB — CYTOLOGY - PAP

## 2016-04-13 ENCOUNTER — Encounter (HOSPITAL_COMMUNITY): Payer: Self-pay | Admitting: *Deleted

## 2016-04-25 ENCOUNTER — Encounter (HOSPITAL_COMMUNITY): Payer: Self-pay

## 2016-04-25 ENCOUNTER — Inpatient Hospital Stay (HOSPITAL_COMMUNITY): Admission: RE | Admit: 2016-04-25 | Payer: BC Managed Care – PPO | Source: Ambulatory Visit

## 2016-04-25 ENCOUNTER — Encounter (HOSPITAL_COMMUNITY)
Admission: RE | Admit: 2016-04-25 | Discharge: 2016-04-25 | Disposition: A | Discharge status: Home / Self Care | Payer: BLUE CROSS/BLUE SHIELD | Source: Ambulatory Visit | Attending: Obstetrics and Gynecology | Admitting: Obstetrics and Gynecology

## 2016-04-25 DIAGNOSIS — Z01818 Encounter for other preprocedural examination: Secondary | ICD-10-CM | POA: Diagnosis present

## 2016-04-25 HISTORY — DX: Other complications of anesthesia, initial encounter: T88.59XA

## 2016-04-25 HISTORY — DX: Adverse effect of unspecified anesthetic, initial encounter: T41.45XA

## 2016-04-25 LAB — TYPE AND SCREEN
ABO/RH(D): O POS
Antibody Screen: NEGATIVE

## 2016-04-25 LAB — ABO/RH: ABO/RH(D): O POS

## 2016-04-25 LAB — CBC
HCT: 37.9 % (ref 36.0–46.0)
HEMOGLOBIN: 12.4 g/dL (ref 12.0–15.0)
MCH: 25.5 pg — AB (ref 26.0–34.0)
MCHC: 32.7 g/dL (ref 30.0–36.0)
MCV: 77.8 fL — AB (ref 78.0–100.0)
PLATELETS: 361 10*3/uL (ref 150–400)
RBC: 4.87 MIL/uL (ref 3.87–5.11)
RDW: 15.5 % (ref 11.5–15.5)
WBC: 8.2 10*3/uL (ref 4.0–10.5)

## 2016-04-25 NOTE — Patient Instructions (Addendum)
Your procedure is scheduled on:  Wednesday, Dec. 20, 2017  [facility] at:  9:45 AM  Pick up the phone at the desk and dial [phone].  Call this number if you have problems the morning of surgery: [phone].  Remember: Do NOT eat food or drink after: Midnight Tuesday, Dec. 19. 2017  Take these medicines the morning of surgery with a SIP OF WATER:  None  Stop ALL herbal medications at this time  DO NOT SMOKE THE DAY OF SURGERY  Do NOT wear jewelry (body piercing), metal hair clips/bobby pins, make-up, or nail polish. Do NOT wear lotions, powders, or perfumes.  You may wear deodorant. Do NOT shave for 48 hours prior to surgery. Do NOT bring valuables to the hospital. Contacts, dentures, or bridgework may not be worn into surgery.  Have a responsible adult drive you home and stay with you for 24 hours after your procedure

## 2016-05-03 ENCOUNTER — Ambulatory Visit (HOSPITAL_COMMUNITY): Payer: BLUE CROSS/BLUE SHIELD | Admitting: Anesthesiology

## 2016-05-03 ENCOUNTER — Ambulatory Visit (HOSPITAL_COMMUNITY)
Admission: RE | Admit: 2016-05-03 | Discharge: 2016-05-03 | Disposition: A | Discharge status: Home / Self Care | Payer: BLUE CROSS/BLUE SHIELD | Source: Ambulatory Visit | Attending: Obstetrics and Gynecology | Admitting: Obstetrics and Gynecology

## 2016-05-03 ENCOUNTER — Encounter (HOSPITAL_COMMUNITY)
Admission: RE | Disposition: A | Discharge status: Home / Self Care | Payer: Self-pay | Source: Ambulatory Visit | Attending: Obstetrics and Gynecology

## 2016-05-03 ENCOUNTER — Encounter (HOSPITAL_COMMUNITY): Payer: Self-pay | Admitting: Anesthesiology

## 2016-05-03 DIAGNOSIS — Z6841 Body Mass Index (BMI) 40.0 and over, adult: Secondary | ICD-10-CM | POA: Insufficient documentation

## 2016-05-03 DIAGNOSIS — N840 Polyp of corpus uteri: Secondary | ICD-10-CM | POA: Diagnosis not present

## 2016-05-03 DIAGNOSIS — N83292 Other ovarian cyst, left side: Secondary | ICD-10-CM | POA: Insufficient documentation

## 2016-05-03 DIAGNOSIS — F329 Major depressive disorder, single episode, unspecified: Secondary | ICD-10-CM | POA: Insufficient documentation

## 2016-05-03 DIAGNOSIS — N800 Endometriosis of uterus: Secondary | ICD-10-CM | POA: Insufficient documentation

## 2016-05-03 DIAGNOSIS — N803 Endometriosis of pelvic peritoneum: Secondary | ICD-10-CM | POA: Insufficient documentation

## 2016-05-03 DIAGNOSIS — F410 Panic disorder [episodic paroxysmal anxiety] without agoraphobia: Secondary | ICD-10-CM | POA: Insufficient documentation

## 2016-05-03 DIAGNOSIS — N946 Dysmenorrhea, unspecified: Secondary | ICD-10-CM | POA: Diagnosis present

## 2016-05-03 HISTORY — DX: Headache, unspecified: R51.9

## 2016-05-03 HISTORY — DX: Headache: R51

## 2016-05-03 HISTORY — PX: LAPAROSCOPY: SHX197

## 2016-05-03 HISTORY — PX: HYSTEROSCOPY WITH D & C: SHX1775

## 2016-05-03 LAB — PREGNANCY, URINE: Preg Test, Ur: NEGATIVE

## 2016-05-03 SURGERY — DILATATION AND CURETTAGE /HYSTEROSCOPY
Anesthesia: General | Site: Vagina

## 2016-05-03 MED ORDER — MEPERIDINE HCL 25 MG/ML IJ SOLN: 6.2500 mg | INTRAMUSCULAR | Status: DC | PRN

## 2016-05-03 MED ORDER — ROCURONIUM BROMIDE 100 MG/10ML IV SOLN
INTRAVENOUS | Status: DC | PRN
  Administered 2016-05-03: 60 mg via INTRAVENOUS

## 2016-05-03 MED ORDER — MIDAZOLAM HCL 2 MG/2ML IJ SOLN
INTRAMUSCULAR | Status: DC | PRN
  Administered 2016-05-03: 2 mg via INTRAVENOUS

## 2016-05-03 MED ORDER — HYDROCODONE-ACETAMINOPHEN 7.5-325 MG PO TABS
ORAL_TABLET | ORAL | Status: DC
Start: 2016-05-03 — End: 2016-05-03
  Filled 2016-05-03: qty 1

## 2016-05-03 MED ORDER — OXYCODONE-ACETAMINOPHEN 5-325 MG PO TABS: 1.0000 | ORAL_TABLET | Freq: Four times a day (QID) | ORAL | 0 refills | Status: AC | PRN

## 2016-05-03 MED ORDER — PROPOFOL 10 MG/ML IV BOLUS
INTRAVENOUS | Status: AC
  Filled 2016-05-03: qty 20

## 2016-05-03 MED ORDER — ONDANSETRON HCL 4 MG/2ML IJ SOLN: 4.0000 mg | Freq: Once | INTRAMUSCULAR | Status: DC | PRN

## 2016-05-03 MED ORDER — IBUPROFEN 200 MG PO TABS
200.0000 mg | ORAL_TABLET | Freq: Four times a day (QID) | ORAL | Status: DC | PRN
  Filled 2016-05-03: qty 2

## 2016-05-03 MED ORDER — KETOROLAC TROMETHAMINE 30 MG/ML IJ SOLN: 30.0000 mg | Freq: Once | INTRAMUSCULAR | Status: DC

## 2016-05-03 MED ORDER — BUPIVACAINE HCL (PF) 0.25 % IJ SOLN
INTRAMUSCULAR | Status: DC | PRN
  Administered 2016-05-03: 30 mL

## 2016-05-03 MED ORDER — SUGAMMADEX SODIUM 200 MG/2ML IV SOLN
INTRAVENOUS | Status: AC
  Filled 2016-05-03: qty 4

## 2016-05-03 MED ORDER — FENTANYL CITRATE (PF) 100 MCG/2ML IJ SOLN
INTRAMUSCULAR | Status: AC
  Administered 2016-05-03: 50 ug via INTRAVENOUS
  Filled 2016-05-03: qty 2

## 2016-05-03 MED ORDER — MIDAZOLAM HCL 2 MG/2ML IJ SOLN
INTRAMUSCULAR | Status: AC
  Filled 2016-05-03: qty 2

## 2016-05-03 MED ORDER — FENTANYL CITRATE (PF) 100 MCG/2ML IJ SOLN
25.0000 ug | INTRAMUSCULAR | Status: DC | PRN
  Administered 2016-05-03 (×2): 50 ug via INTRAVENOUS

## 2016-05-03 MED ORDER — KETOROLAC TROMETHAMINE 30 MG/ML IJ SOLN
INTRAMUSCULAR | Status: DC | PRN
  Administered 2016-05-03: 30 mg via INTRAVENOUS

## 2016-05-03 MED ORDER — ONDANSETRON HCL 4 MG/2ML IJ SOLN
INTRAMUSCULAR | Status: AC
  Filled 2016-05-03: qty 2

## 2016-05-03 MED ORDER — LIDOCAINE HCL (CARDIAC) 20 MG/ML IV SOLN
INTRAVENOUS | Status: DC | PRN
  Administered 2016-05-03: 80 mg via INTRAVENOUS

## 2016-05-03 MED ORDER — LIDOCAINE HCL (CARDIAC) 20 MG/ML IV SOLN
INTRAVENOUS | Status: AC
  Filled 2016-05-03: qty 5

## 2016-05-03 MED ORDER — NEOSTIGMINE METHYLSULFATE 10 MG/10ML IV SOLN
INTRAVENOUS | Status: AC
  Filled 2016-05-03: qty 1

## 2016-05-03 MED ORDER — SCOPOLAMINE 1 MG/3DAYS TD PT72
MEDICATED_PATCH | TRANSDERMAL | Status: AC
  Administered 2016-05-03: 1.5 mg via TRANSDERMAL
  Filled 2016-05-03: qty 1

## 2016-05-03 MED ORDER — FENTANYL CITRATE (PF) 100 MCG/2ML IJ SOLN
INTRAMUSCULAR | Status: DC | PRN
  Administered 2016-05-03 (×2): 50 ug via INTRAVENOUS
  Administered 2016-05-03: 25 ug via INTRAVENOUS
  Administered 2016-05-03: 50 ug via INTRAVENOUS
  Administered 2016-05-03: 100 ug via INTRAVENOUS
  Administered 2016-05-03 (×2): 50 ug via INTRAVENOUS

## 2016-05-03 MED ORDER — DEXAMETHASONE SODIUM PHOSPHATE 10 MG/ML IJ SOLN
INTRAMUSCULAR | Status: DC | PRN
  Administered 2016-05-03: 4 mg via INTRAVENOUS

## 2016-05-03 MED ORDER — IBUPROFEN 800 MG PO TABS: ORAL_TABLET | ORAL | 1 refills | Status: AC

## 2016-05-03 MED ORDER — SCOPOLAMINE 1 MG/3DAYS TD PT72
1.0000 | MEDICATED_PATCH | Freq: Once | TRANSDERMAL | Status: DC
  Administered 2016-05-03: 1.5 mg via TRANSDERMAL

## 2016-05-03 MED ORDER — FENTANYL CITRATE (PF) 250 MCG/5ML IJ SOLN
INTRAMUSCULAR | Status: AC
  Filled 2016-05-03: qty 5

## 2016-05-03 MED ORDER — IBUPROFEN 100 MG/5ML PO SUSP
200.0000 mg | Freq: Four times a day (QID) | ORAL | Status: DC | PRN
  Filled 2016-05-03: qty 20

## 2016-05-03 MED ORDER — HYDROCODONE-ACETAMINOPHEN 7.5-325 MG PO TABS
1.0000 | ORAL_TABLET | Freq: Once | ORAL | Status: AC | PRN
  Administered 2016-05-03: 1 via ORAL

## 2016-05-03 MED ORDER — LIDOCAINE HCL 2 % IJ SOLN
INTRAMUSCULAR | Status: AC
Start: 2016-05-03 — End: 2016-05-03
  Filled 2016-05-03: qty 20

## 2016-05-03 MED ORDER — GLYCOPYRROLATE 0.2 MG/ML IJ SOLN
INTRAMUSCULAR | Status: AC
  Filled 2016-05-03: qty 4

## 2016-05-03 MED ORDER — DEXAMETHASONE SODIUM PHOSPHATE 4 MG/ML IJ SOLN
INTRAMUSCULAR | Status: AC
  Filled 2016-05-03: qty 1

## 2016-05-03 MED ORDER — ROCURONIUM BROMIDE 100 MG/10ML IV SOLN
INTRAVENOUS | Status: AC
  Filled 2016-05-03: qty 1

## 2016-05-03 MED ORDER — ONDANSETRON HCL 4 MG/2ML IJ SOLN
INTRAMUSCULAR | Status: DC | PRN
Start: 2016-05-03 — End: 2016-05-03
  Administered 2016-05-03: 4 mg via INTRAVENOUS

## 2016-05-03 MED ORDER — LACTATED RINGERS IV SOLN
INTRAVENOUS | Status: DC
  Administered 2016-05-03 (×2): via INTRAVENOUS

## 2016-05-03 MED ORDER — GLYCOPYRROLATE 0.2 MG/ML IJ SOLN
INTRAMUSCULAR | Status: DC | PRN
  Administered 2016-05-03: .8 mg via INTRAVENOUS

## 2016-05-03 MED ORDER — PROPOFOL 10 MG/ML IV BOLUS
INTRAVENOUS | Status: DC | PRN
  Administered 2016-05-03: 200 mg via INTRAVENOUS

## 2016-05-03 MED ORDER — NEOSTIGMINE METHYLSULFATE 10 MG/10ML IV SOLN
INTRAVENOUS | Status: DC | PRN
  Administered 2016-05-03: 5 mg via INTRAVENOUS

## 2016-05-03 MED ORDER — KETOROLAC TROMETHAMINE 30 MG/ML IJ SOLN
INTRAMUSCULAR | Status: AC
  Filled 2016-05-03: qty 1

## 2016-05-03 MED ORDER — DEXTROSE 5 % IV SOLN
3.0000 g | INTRAVENOUS | Status: AC
  Administered 2016-05-03: 3 g via INTRAVENOUS
  Filled 2016-05-03: qty 3000

## 2016-05-03 MED ORDER — OXYCODONE-ACETAMINOPHEN 5-325 MG PO TABS: 1.0000 | ORAL_TABLET | Freq: Four times a day (QID) | ORAL | 0 refills | Status: DC | PRN

## 2016-05-03 MED ORDER — BUPIVACAINE HCL (PF) 0.25 % IJ SOLN
INTRAMUSCULAR | Status: AC
  Filled 2016-05-03: qty 30

## 2016-05-03 SURGICAL SUPPLY — 37 items
BENZOIN TINCTURE PRP APPL 2/3 (GAUZE/BANDAGES/DRESSINGS) IMPLANT
CABLE HIGH FREQUENCY MONO STRZ (ELECTRODE) ×4 IMPLANT
CANISTER SUCT 3000ML (MISCELLANEOUS) ×4 IMPLANT
CATH ROBINSON RED A/P 16FR (CATHETERS) ×4 IMPLANT
CLOTH BEACON ORANGE TIMEOUT ST (SAFETY) ×4 IMPLANT
CONTAINER PREFILL 10% NBF 60ML (FORM) ×8 IMPLANT
DERMABOND ADVANCED (GAUZE/BANDAGES/DRESSINGS) ×2
DERMABOND ADVANCED .7 DNX12 (GAUZE/BANDAGES/DRESSINGS) ×2 IMPLANT
DILATOR CANAL MILEX (MISCELLANEOUS) ×4 IMPLANT
DRSG OPSITE POSTOP 3X4 (GAUZE/BANDAGES/DRESSINGS) IMPLANT
DURAPREP 26ML APPLICATOR (WOUND CARE) ×4 IMPLANT
FORCEPS CUTTING 33CM 5MM (CUTTING FORCEPS) IMPLANT
GLOVE BIO SURGEON STRL SZ7 (GLOVE) ×4 IMPLANT
GLOVE BIOGEL PI IND STRL 7.0 (GLOVE) ×6 IMPLANT
GLOVE BIOGEL PI INDICATOR 7.0 (GLOVE) ×6
GOWN STRL REUS W/TWL LRG LVL3 (GOWN DISPOSABLE) ×12 IMPLANT
PACK LAPAROSCOPY BASIN (CUSTOM PROCEDURE TRAY) ×4 IMPLANT
PACK TRENDGUARD 600 HYBRD PROC (MISCELLANEOUS) ×2 IMPLANT
PACK VAGINAL MINOR WOMEN LF (CUSTOM PROCEDURE TRAY) ×4 IMPLANT
PAD OB MATERNITY 4.3X12.25 (PERSONAL CARE ITEMS) ×4 IMPLANT
POUCH SPECIMEN RETRIEVAL 10MM (ENDOMECHANICALS) IMPLANT
PROTECTOR NERVE ULNAR (MISCELLANEOUS) ×8 IMPLANT
SET IRRIG TUBING LAPAROSCOPIC (IRRIGATION / IRRIGATOR) IMPLANT
SLEEVE XCEL OPT CAN 5 100 (ENDOMECHANICALS) ×4 IMPLANT
SUT MNCRL AB 4-0 PS2 18 (SUTURE) ×4 IMPLANT
SUT PLAIN 2 0 (SUTURE)
SUT PLAIN ABS 2-0 CT1 27XMFL (SUTURE) IMPLANT
SUT VICRYL 0 UR6 27IN ABS (SUTURE) ×4 IMPLANT
TOWEL OR 17X24 6PK STRL BLUE (TOWEL DISPOSABLE) ×8 IMPLANT
TRAY FOLEY CATH SILVER 14FR (SET/KITS/TRAYS/PACK) ×4 IMPLANT
TRENDGUARD 600 HYBRID PROC PK (MISCELLANEOUS) ×4
TROCAR XCEL NON-BLD 11X100MML (ENDOMECHANICALS) IMPLANT
TROCAR XCEL NON-BLD 5MMX100MML (ENDOMECHANICALS) ×4 IMPLANT
TUBING AQUILEX INFLOW (TUBING) ×4 IMPLANT
TUBING AQUILEX OUTFLOW (TUBING) ×4 IMPLANT
WARMER LAPAROSCOPE (MISCELLANEOUS) ×4 IMPLANT
WATER STERILE IRR 1000ML POUR (IV SOLUTION) ×4 IMPLANT

## 2016-05-03 NOTE — Anesthesia Postprocedure Evaluation (Signed)
Anesthesia Post Note  Patient: Gorden HarmsMegan Tiede  Procedure(s) Performed: Procedure(s) (LRB): DILATATION AND CURETTAGE /HYSTEROSCOPY (N/A) LAPAROSCOPY DIAGNOSTIC WITH LYSIS OF ADHESIONS,DRAINAGE OF LEFT OVARIAN CYST, BIOPSY OF PELVIC SIDEWALL (N/A)  Patient location during evaluation: PACU Anesthesia Type: General Level of consciousness: sedated Pain management: pain level controlled Vital Signs Assessment: post-procedure vital signs reviewed and stable Respiratory status: spontaneous breathing Cardiovascular status: stable Postop Assessment: no signs of nausea or vomiting Anesthetic complications: no        Last Vitals:  Vitals:   05/03/16 1002 05/03/16 1200  BP: (!) 146/94 130/81  Pulse:  98  Resp:  18  Temp:  36.9 C    Last Pain:  Vitals:   05/03/16 0938  TempSrc: Oral   Pain Goal: Patients Stated Pain Goal: 4 (05/03/16 0938)               Debria Broecker JR,JOHN Susann GivensFRANKLIN

## 2016-05-03 NOTE — Interval H&P Note (Signed)
History and Physical Interval Note:  05/03/2016 10:07 AM  Becky HarmsMegan Orsini  has presented today for surgery, with the diagnosis of N94.6 Dysmenorrhea  The various methods of treatment have been discussed with the patient and family. After consideration of risks, benefits and other options for treatment, the patient has consented to  Procedure(s): DILATATION AND CURETTAGE /HYSTEROSCOPY (N/A) LAPAROSCOPY DIAGNOSTIC (N/A) as a surgical intervention .  The patient's history has been reviewed, patient examined, no change in status, stable for surgery.  I have reviewed the patient's chart and labs.  Questions were answered to the patient's satisfaction.     Dion BodyVARNADO, Jannette Cotham

## 2016-05-03 NOTE — Discharge Instructions (Addendum)
Diagnostic Laparoscopy, Care After Introduction Refer to this sheet in the next few weeks. These instructions provide you with information about caring for yourself after your procedure. Your health care provider may also give you more specific instructions. Your treatment has been planned according to current medical practices, but problems sometimes occur. Call your health care provider if you have any problems or questions after your procedure. What can I expect after the procedure? After your procedure, it is common to have mild discomfort in the throat and abdomen. Follow these instructions at home:  Take over-the-counter and prescription medicines only as told by your health care provider.  Do not drive for 24 hours if you received a sedative.  Return to your normal activities as told by your health care provider.  Do not take baths, swim, or use a hot tub until your health care provider approves. You may shower.  Follow instructions from your health care provider about how to take care of your incision. Make sure you:  Wash your hands with soap and water before you change your bandage (dressing). If soap and water are not available, use hand sanitizer.  Change your dressing as told by your health care provider.  Leave stitches (sutures), skin glue, or adhesive strips in place. These skin closures may need to stay in place for 2 weeks or longer. If adhesive strip edges start to loosen and curl up, you may trim the loose edges. Do not remove adhesive strips completely unless your health care provider tells you to do that.  Check your incision area every day for signs of infection. Check for:  More redness, swelling, or pain.  More fluid or blood.  Warmth.  Pus or a bad smell.  It is your responsibility to get the results of your procedure. Ask your health care provider or the department performing the procedure when your results will be ready. Contact a health care provider  if:  There is new pain in your shoulders.  You feel light-headed or faint.  You are unable to pass gas or unable to have a bowel movement.  You feel nauseous or you vomit.  You develop a rash.  You have more redness, swelling, or pain around your incision.  You have more fluid or blood coming from your incision.  Your incision feels warm to the touch.  You have pus or a bad smell coming from your incision.  You have a fever or chills. Get help right away if:  Your pain is getting worse.  You have ongoing vomiting.  The edges of your incision open up.  You have trouble breathing.  You have chest pain. This information is not intended to replace advice given to you by your health care provider. Make sure you discuss any questions you have with your health care provider. Document Released: 04/12/2015 Document Revised: 10/07/2015 Document Reviewed: 01/12/2015  2017 [employer]   You may remove the patch behind your ear on or before Saturday 05/06/16. Wash your hands with soap and water after any contact with the patch.  You may start ibuprofen at 6pm 05/03/16.

## 2016-05-03 NOTE — H&P (Signed)
History of Present Illness  General:  Pt presents for preop for Diagnostic Laparoscopy, Hysteroscopy/D&C due to severe dysmenorrhea and thickened endometrium and obesity. Motrin 800 mg is helping take the edge off, Last dose taken this morning. Wearing a TENS unit. Pt agreeable to stop by 12/8. Menses are 2 days. Thickened Endometrium on ultrasound.   Current Medications  Taking   Multivitamin . Tablet 1 tablet by mouth Once daily   Probiotic - Tablet Delayed Release Orally   Ibuprofen 800 MG Tablet 1 tablet Orally every 8 hrs x 24 hours prior to menses then q 8 hours prn pain   Excedrin Migraine(Aspirin-Acetaminophen-Caffeine) 250-250-65 MG Tablet 1 tablet Orally 1 per week as needed, Notes: Last taken 1 month ago   Prozac(FLUoxetine HCl) 20 MG Capsule 1 capsule in the morning Orally Once a day, Notes: Has not started yet   Not-Taking/PRN   BuPROPion HCl ER (XL) 150 MG Tablet Extended Release 24 Hour 1 tablet in the morning Orally Once a day, Notes: Stopped 04/05/16   Discontinued   SAM-e 400 MG Tablet Orally   Medication List reviewed and reconciled with the patient    Past Medical History  PMDD.   Depression/Anxiety.   Dysmenorrhea.   Morbid Obesity (BMI 47).   Panic attacks.           Surgical History  Open Appendectomy, ruptured, lrg abscess, wound breakdown 2011  Fallopian tubes removed 2016  TOA 2015  Wisdom teeth extracted 2009   Family History  Father: deceased, MI at 6551  Mother: alive, A + W  Paternal Grand Mother: deceased, Ovarian cancer   Social History  General:  Tobacco use  cigarettes: Never smoked Tobacco history last updated 04/18/2016 no Alcohol.  no Recreational drug use.  Exercise: minimal.  Marital Status: married.  Children: none.  COMMUNICATION BARRIERS: visual impairment, wears glasses.    Gyn History  Sexual activity currently sexually active.  Periods : every month.  LMP 04/16/16.  Birth control Bilateral salpingectomy.  Last  pap smear date 01/05/16, negative.  Denies H/O Last mammogram date.  Denies H/O Abnormal pap smear.  Denies H/O STD.    OB History  Never been pregnant per patient.    Allergies  OCPs: Mood swings  Tramadol: burning sensation of eyes and nose, lasted three days after taking   Hospitalization/Major Diagnostic Procedure  see surgery    Review of Systems  Denies fever/chills, chest pain, SOB, headaches, numbness/tingling. No h/o complication with anesthesia, bleeding disorders or blood clots.   Vital Signs  Wt 313, Wt change 1.2 lb, Ht 68, BMI 47.59, Pulse sitting 90, BP sitting 150/90, Repeat BP 123/83.   Physical Examination  GENERAL:  Patient appears alert and oriented.  General Appearance: well-appearing, well-developed, no acute distress.  Speech: clear.  LUNGS:  Auscultation: no wheezing/rhonchi/rales. CTA bilaterally.  HEART:  Heart sounds: normal. RRR. no murmur.  ABDOMEN:  General: soft nontender, nondistended, no masses.  FEMALE GENITOURINARY:  Pelvic Not examined.  EXTREMITIES:  General: No edema or calf tenderness.     Assessments   1. Pre-operative clearance - Z01.818 (Primary)   2. Dysmenorrhea - N94.6   3. Thickened endometrium - R93.8   4. Morbid obesity - E66.01   5. Panic attacks - F41.0   Treatment  1. Pre-operative clearance  Notes: R/B/A of procedures reviewed. Recommend ablation and/or biopsies prn endometriosis. Pt understands procedure will be limited if frozen pelvis. Pt is not opposed to blood transfusion.    2. Panic attacks  Start Xanax Tablet, 0.5 MG, 1 tablet, Orally, At least 30 min prior to entering surgical holding. Notes: Pt given handwritten rx. Pt to sign consent day prior to surgery. Approved with preop RN.    Follow Up  2 Weeks post op

## 2016-05-03 NOTE — Transfer of Care (Signed)
Immediate Anesthesia Transfer of Care Note  Patient: Becky Williamson  Procedure(s) Performed: Procedure(s): DILATATION AND CURETTAGE /HYSTEROSCOPY (N/A) LAPAROSCOPY DIAGNOSTIC WITH LYSIS OF ADHESIONS,DRAINAGE OF LEFT OVARIAN CYST, BIOPSY OF PELVIC SIDEWALL (N/A)  Patient Location: PACU  Anesthesia Type:General  Level of Consciousness: awake, alert , oriented and patient cooperative  Airway & Oxygen Therapy: Patient Spontanous Breathing and Patient connected to nasal cannula oxygen  Post-op Assessment: Report given to RN and Post -op Vital signs reviewed and stable  Post vital signs: Reviewed and stable  Last Vitals:  Vitals:   05/03/16 0938 05/03/16 1002  BP:  (!) 146/94  Pulse: 100   Resp: (!) 118   Temp: 36.7 C     Last Pain:  Vitals:   05/03/16 0938  TempSrc: Oral      Patients Stated Pain Goal: 4 (05/03/16 [id number])  Complications: No apparent anesthesia complications

## 2016-05-03 NOTE — Anesthesia Preprocedure Evaluation (Addendum)
Anesthesia Evaluation  Patient identified by MRN, date of birth, ID band Patient awake    Reviewed: Allergy & Precautions, H&P , NPO status , Patient's Chart, lab work & pertinent test results  Airway Mallampati: I  TM Distance: >3 FB Neck ROM: full    Dental no notable dental hx. (+) Teeth Intact   Pulmonary neg pulmonary ROS,    Pulmonary exam normal        Cardiovascular negative cardio ROS Normal cardiovascular exam     Neuro/Psych PSYCHIATRIC DISORDERS    GI/Hepatic negative GI ROS, Neg liver ROS,   Endo/Other  Morbid obesity  Renal/GU negative Renal ROS     Musculoskeletal   Abdominal (+) + obese,   Peds  Hematology negative hematology ROS (+)   Anesthesia Other Findings   Reproductive/Obstetrics negative OB ROS                             Anesthesia Physical  Anesthesia Plan  ASA: III  Anesthesia Plan: General   Post-op Pain Management:    Induction: Intravenous  Airway Management Planned: Oral ETT  Additional Equipment:   Intra-op Plan:   Post-operative Plan: Extubation in OR  Informed Consent: I have reviewed the patients History and Physical, chart, labs and discussed the procedure including the risks, benefits and alternatives for the proposed anesthesia with the patient or authorized representative who has indicated his/her understanding and acceptance.   Dental Advisory Given  Plan Discussed with: CRNA and Surgeon  Anesthesia Plan Comments:        Anesthesia Quick Evaluation

## 2016-05-03 NOTE — Brief Op Note (Signed)
05/03/2016  12:29 PM  PATIENT:  Becky Williamson  30 y.o. female  PRE-OPERATIVE DIAGNOSIS:  N94.6 Dysmenorrhea  POST-OPERATIVE DIAGNOSIS:  ENDOMETRIOSIS,ENDOMETRIAL POLYP,ADHESIONS   PROCEDURE:  Procedure(s): DILATATION AND CURETTAGE /HYSTEROSCOPY (N/A) LAPAROSCOPY DIAGNOSTIC WITH LYSIS OF ADHESIONS,DRAINAGE OF LEFT OVARIAN CYST, BIOPSY OF PELVIC SIDEWALL (N/A)  SURGEON:  Surgeon(s) and Role:    * Geryl RankinsEvelyn Waylon Koffler, MD - Primary  PHYSICIAN ASSISTANT:   ASSISTANTS: Myna HidalgoJennifer Ozan, DO, technician   ANESTHESIA:   local and general  EBL:  Total I/O In: 1200 [I.V.:1200] Out: 160 [Urine:150; Blood:10]  BLOOD ADMINISTERED:none  DRAINS: Urinary Catheter (Foley)   LOCAL MEDICATIONS USED:  MARCAINE     SPECIMEN:  Source of Specimen:  right pelvic wall biopsy, endometrial currettings  DISPOSITION OF SPECIMEN:  PATHOLOGY  COUNTS:  YES  TOURNIQUET:  * No tourniquets in log *  DICTATION: .Other Dictation: Dictation Number [id number]  PLAN OF CARE: Discharge to home after PACU  PATIENT DISPOSITION:  PACU - hemodynamically stable.   Delay start of Pharmacological VTE agent (>24hrs) due to surgical blood loss or risk of bleeding: yes

## 2016-05-03 NOTE — Anesthesia Procedure Notes (Signed)
Procedure Name: Intubation Date/Time: 05/03/2016 10:29 AM Performed by: Yolonda KidaARVER, Yamilett Anastos L Pre-anesthesia Checklist: Patient identified, Emergency Drugs available, Suction available and Patient being monitored Patient Re-evaluated:Patient Re-evaluated prior to inductionOxygen Delivery Method: Circle system utilized Preoxygenation: Pre-oxygenation with 100% oxygen Intubation Type: IV induction Ventilation: Mask ventilation without difficulty Laryngoscope Size: Mac and 3 Grade View: Grade I Tube type: Oral Tube size: 7.0 mm Number of attempts: 1 Airway Equipment and Method: Stylet Placement Confirmation: ETT inserted through vocal cords under direct vision,  positive ETCO2,  CO2 detector and breath sounds checked- equal and bilateral Secured at: 21 cm Tube secured with: Tape Dental Injury: Teeth and Oropharynx as per pre-operative assessment

## 2016-05-04 ENCOUNTER — Encounter (HOSPITAL_COMMUNITY): Payer: Self-pay | Admitting: Obstetrics and Gynecology

## 2016-05-04 NOTE — Op Note (Deleted)
  The note originally documented on this encounter has been moved the the encounter in which it belongs.  

## 2016-05-04 NOTE — Op Note (Signed)
NAMGorden Williamson:  Aune, Eliyanna              ACCOUNT NO.:  [id number]  MEDICAL RECORD NO.:  [id number]  LOCATION:                                 FACILITY:  PHYSICIAN:  Pieter PartridgeEvelyn B Juwann Sherk, MD   DATE OF BIRTH:  [birth year]  DATE OF PROCEDURE:  05/03/2016 DATE OF DISCHARGE:  05/03/2016                              OPERATIVE REPORT   PREOPERATIVE DIAGNOSIS:  Dysmenorrhea.  POSTOPERATIVE DIAGNOSES:  Endometriosis, endometrial polyps, and adhesions.  PROCEDURES: 1. Diagnostic laparoscopy with lysis of adhesions. 2. Drainage of left ovarian cyst. 3. Biopsy of right pelvic sidewall. 4. Hysteroscopy, D and C.  ASSISTANT:  Dr. Myna HidalgoJennifer Ozan.  ANESTHESIA:  Local and general.  ESTIMATED BLOOD LOSS:  10.  URINE:  150.  BLOOD ADMINISTERED:  None.  DRAINS:  Urinary catheter Foley.  Local is Marcaine.  SPECIMENS:  Right pelvic wall biopsy and endometrial curettings.  DISPOSITION OF SPECIMENS:  To Pathology.  PATIENT DISPOSITION:  To PACU, hemodynamically stable.  FINDINGS:  Dense omental adhesions through a vertical midline incision. Normal liver edge.  Endometrial implant on the left horn of the uterus. Adhesions in the anterior cul-de-sac with window.  Endometrial implant on the right pelvic sidewall.  Right ovary densely adhesed to the right pelvic sidewall.  Left ovary with a small simple cyst with clear fluid. Ovary appeared normal.  Hyperemia and endometrial inflammation on the uterus and in the bilateral ovaries.  PROCEDURE IN DETAIL:  Ms. Orvan FalconerCampbell was identified in the holding area. She was then taken to the operating room with IV running.  She was placed in the dorsal lithotomy position and underwent general endotracheal anesthesia without complication.  She was then prepped and draped in a normal sterile fashion.  A time-out was performed.  Ancef 3 g IV was administered prior to procedure.  SCDs were on and operating throughout the procedure.  A Graves speculum was inserted  into the vagina.  Cervix was visualized after confirming an anteverted uterus.  The acorn manipulator was advanced through the endocervical os, a small acorn.  A single-tooth tenaculum was applied to the anterior lip of the cervix to hold the acorn in place.  The Graves was eventually removed when we went on top.  Because of the patient's midline incision, we went above the umbilicus, about 3 fingerbreadths above.  Marcaine was used to inject the subcutaneous space in the incision.  A 5-mm trocar was inserted with the camera under direct visualization.  Intra-abdominal access was confirmed.  CO2 gas was used to insufflate the abdomen.  The omental adhesion was right down the middle of the abdomen, so we could see the sidewall on either side of this dense adhesion.  So, we were eventually able to put in a 5-mm trocar under direct visualization after using Marcaine and the same on the left.  Transillumination was performed to prevent an injury to any significant blood vessels.  A survey of the abdominal cavity was performed.  The adhesions were taken down loosely with the scissors, not opened, and not using cautery because they were somewhat filmy and those were the adhesions of the anterior cul-de-sac.  Biopsy was performed. The peritoneum was tented up and  the scissors were used to cauterize the peritoneum around the endometrial implant and that site was hemostatic. The simple cyst on the left ovary was incised with the scissors and then clear fluid was noted and it was drained.  Eventually, it was irrigated and cauterized the ovarian parenchyma for hemostasis.  Ovarian stroma.  I attempted to start to take down the omental adhesions, the filmy part, but that was not very effective due to the thickness of them and that the patient did not complain of pain in the abdomen in that particular area and that was aborted.  There were not other things that were very obvious that could be done  at the moment.  We removed the 5-mm trocars under direct visualization.  We left the umbilical trocar again to allow CO2 gas to escape.  The incisions were then closed subcutaneously with 4-0 Monocryl in an interrupted fashion.  Dermabond was then applied to the incision.  Attention was turned to the pelvis, the vagina, and to the vulva.  The acorn manipulator was removed.  The cervix was dilated up to a 6 Hegar. The hysteroscope was then advanced.  The appearance was consistent with having had the acorn in the cervix.  The fundus was then identified.  No perforation was noted.  There did appear to be a few masses.  Pictures were obtained.  The hysteroscope was removed from the uterus and a sharp curettage of the uterus was performed.  There were 3 long polyps that were retrieved from the curettage.  Thus, endometrial curettings were collected and then sent to Pathology.  The uterus was then hemostatic.  The single- tooth tenaculum was taken off and the tenaculum site was hemostatic. The Graves speculum was removed from the vagina.  All instrument, sponge, and needle counts were correct x3.  The patient tolerated the procedure well.  She was taken to the operating room in stable condition.     Pieter PartridgeEvelyn B Amandeep Nesmith, MD     EBV/MEDQ  D:  05/03/2016  T:  05/04/2016  Job:  [id number]

## 2016-05-05 NOTE — Addendum Note (Signed)
Addendum  created 05/05/16 [id number] by Leilani AbleFranklin Cadence Haslam, MD   Sign clinical note

## 2016-05-18 ENCOUNTER — Emergency Department (HOSPITAL_COMMUNITY)
Admission: EM | Admit: 2016-05-18 | Discharge: 2016-05-18 | Disposition: A | Discharge status: Home / Self Care | Payer: BLUE CROSS/BLUE SHIELD | Attending: Emergency Medicine | Admitting: Emergency Medicine

## 2016-05-18 ENCOUNTER — Encounter (HOSPITAL_COMMUNITY): Payer: Self-pay | Admitting: Emergency Medicine

## 2016-05-18 DIAGNOSIS — F419 Anxiety disorder, unspecified: Secondary | ICD-10-CM | POA: Diagnosis not present

## 2016-05-18 DIAGNOSIS — Z79899 Other long term (current) drug therapy: Secondary | ICD-10-CM | POA: Diagnosis not present

## 2016-05-18 DIAGNOSIS — Z7982 Long term (current) use of aspirin: Secondary | ICD-10-CM | POA: Diagnosis not present

## 2016-05-18 DIAGNOSIS — R002 Palpitations: Secondary | ICD-10-CM | POA: Diagnosis not present

## 2016-05-18 LAB — COMPREHENSIVE METABOLIC PANEL
ALBUMIN: 3.8 g/dL (ref 3.5–5.0)
ALT: 16 U/L (ref 14–54)
AST: 20 U/L (ref 15–41)
Alkaline Phosphatase: 77 U/L (ref 38–126)
Anion gap: 11 (ref 5–15)
BUN: 9 mg/dL (ref 6–20)
CHLORIDE: 106 mmol/L (ref 101–111)
CO2: 22 mmol/L (ref 22–32)
CREATININE: 0.74 mg/dL (ref 0.44–1.00)
Calcium: 9.7 mg/dL (ref 8.9–10.3)
GFR calc Af Amer: >60 mL/min (ref >60)
GFR calc non Af Amer: >60 mL/min (ref >60)
GLUCOSE: 120 mg/dL — AB (ref 65–99)
POTASSIUM: 3.6 mmol/L (ref 3.5–5.1)
Sodium: 139 mmol/L (ref 135–145)
Total Bilirubin: 0.4 mg/dL (ref 0.3–1.2)
Total Protein: 7.8 g/dL (ref 6.5–8.1)

## 2016-05-18 LAB — CBC WITH DIFFERENTIAL/PLATELET
BASOS PCT: 0 %
Basophils Absolute: 0 10*3/uL (ref 0.0–0.1)
EOS PCT: 1 %
Eosinophils Absolute: 0.2 10*3/uL (ref 0.0–0.7)
HEMATOCRIT: 37.5 % (ref 36.0–46.0)
Hemoglobin: 12.4 g/dL (ref 12.0–15.0)
LYMPHS PCT: 17 %
Lymphs Abs: 2.3 10*3/uL (ref 0.7–4.0)
MCH: 25.4 pg — ABNORMAL LOW (ref 26.0–34.0)
MCHC: 33.1 g/dL (ref 30.0–36.0)
MCV: 76.8 fL — ABNORMAL LOW (ref 78.0–100.0)
MONO ABS: 0.7 10*3/uL (ref 0.1–1.0)
Monocytes Relative: 5 %
NEUTROS ABS: 10.6 10*3/uL — AB (ref 1.7–7.7)
Neutrophils Relative %: 77 %
PLATELETS: 355 10*3/uL (ref 150–400)
RBC: 4.88 MIL/uL (ref 3.87–5.11)
RDW: 15.1 % (ref 11.5–15.5)
WBC: 13.8 10*3/uL — AB (ref 4.0–10.5)

## 2016-05-18 LAB — I-STAT BETA HCG BLOOD, ED (MC, WL, AP ONLY): I-stat hCG, quantitative: <5 m[IU]/mL (ref <5)

## 2016-05-18 MED ORDER — DIAZEPAM 2 MG PO TABS: 2.0000 mg | ORAL_TABLET | Freq: Two times a day (BID) | ORAL | 0 refills | Status: AC | PRN

## 2016-05-18 MED ORDER — DIAZEPAM 2 MG PO TABS
2.0000 mg | ORAL_TABLET | Freq: Once | ORAL | Status: AC
  Administered 2016-05-18: 2 mg via ORAL
  Filled 2016-05-18: qty 1

## 2016-05-18 NOTE — ED Provider Notes (Addendum)
[facility] Provider Note   CSN: [id number] Arrival date & time: 05/18/16  0605     History   Chief Complaint Chief Complaint  Patient presents with  . Palpitations    HPI Becky Williamson is a 31 y.o. female.  HPI  31 year old female with a history of depression, generalized anxiety, endometriosis status post recent diagnostic laparoscopy and hysteroscopic D&C presents to the ED after her postop follow-up appointment for palpitations, anxiety, elevated pressures. Patient reports that she's been under a lot of stress recently regarding her recent diagnosis of endometriosis and the lack of available treatments due to the extent of her disease. She reports that her postop period has been uncomplicated. Her postop visit went well; however they noted that the patient's blood pressure and heart rate were elevated. She reports that she was extremely anxious at that time. They decided to go home to see if she can contact our however she's been unable to. She denies any fevers, chills, URI symptoms, flulike symptoms, chest pain, shortness of breath, nausea, vomiting, diarrhea, abdominal pain, dysuria, vaginal bleeding or discharge.  She is aware of her anxiety and believes this is related to it; just wanted to be checked out and reassured.  Past Medical History:  Diagnosis Date  . Anxiety   . Complication of anesthesia    uncontrolled crying  . Depression   . Headache     Patient Active Problem List   Diagnosis Date Noted  . Vitamin D deficiency 06/27/2013  . Obesity, unspecified 06/27/2013  . Migraine 06/27/2013  . Premenstrual dysphoric disorder 06/27/2013  . TOA (tubo-ovarian abscess) 06/25/2013    Past Surgical History:  Procedure Laterality Date  . APPENDECTOMY  2011  . HYSTEROSCOPY W/D&C N/A 05/03/2016   Procedure: DILATATION AND CURETTAGE /HYSTEROSCOPY;  Surgeon: Geryl RankinsEvelyn Varnado, MD;  Location: [facility];  Service: Gynecology;  Laterality: N/A;  . LAPAROSCOPIC BILATERAL  SALPINGECTOMY Bilateral 11/05/2014   Procedure: LAPAROSCOPIC BILATERAL SALPINGECTOMY;  Surgeon: Osborn CohoAngela Roberts, MD;  Location: [facility];  Service: Gynecology;  Laterality: Bilateral;  . LAPAROSCOPY N/A 05/03/2016   Procedure: LAPAROSCOPY DIAGNOSTIC WITH LYSIS OF ADHESIONS,DRAINAGE OF LEFT OVARIAN CYST, BIOPSY OF PELVIC SIDEWALL;  Surgeon: Geryl RankinsEvelyn Varnado, MD;  Location: [facility];  Service: Gynecology;  Laterality: N/A;  . WISDOM TOOTH EXTRACTION      OB History    No data available       Home Medications    Prior to Admission medications   Medication Sig Start Date End Date Taking? Authorizing Provider  aspirin-acetaminophen-caffeine (EXCEDRIN MIGRAINE) [phone] MG per tablet Take 2 tablets by mouth every 6 (six) hours as needed for headache.    Historical Provider, MD  diazepam (VALIUM) 2 MG tablet Take 1 tablet (2 mg total) by mouth every 12 (twelve) hours as needed for anxiety. 05/18/16 05/25/16  Nira ConnPedro Eduardo Navi Ewton, MD  FLUoxetine (PROZAC) 20 MG tablet Take 20 mg by mouth at bedtime.    Historical Provider, MD  ibuprofen (ADVIL,MOTRIN) 800 MG tablet 1  PO  PC EVERY 6 HOURS FOR 5 DAYS THEN AS NEEDED FOR PAIN 05/03/16   Geryl RankinsEvelyn Varnado, MD  Multiple Vitamin (MULTIVITAMIN WITH MINERALS) TABS tablet Take 1 tablet by mouth daily.    Historical Provider, MD  oxyCODONE-acetaminophen (PERCOCET/ROXICET) 5-325 MG tablet Take 1-2 tablets by mouth every 6 (six) hours as needed for severe pain. 05/03/16   Geryl RankinsEvelyn Varnado, MD  Probiotic Product (PROBIOTIC PO) Take 1 tablet by mouth daily.    Historical Provider, MD    [facility]  History Family History  Problem Relation Age of Onset  . Hypertension Mother   . Heart disease Mother   . Hypertension Father   . Heart disease Father     Social History Social History  Substance Use Topics  . Smoking status: Never Smoker  . Smokeless tobacco: Never Used  . Alcohol use Yes     Comment: social     Allergies   Other   Review of Systems Review of  Systems Ten systems are reviewed and are negative for acute change except as noted in the HPI   Physical Exam Updated Vital Signs BP 156/88 (BP Location: Left Arm)   Pulse 117   Temp 97.9 F (36.6 C) (Oral)   Resp 20   LMP 05/14/2016   SpO2 99%   Physical Exam  Constitutional: She is oriented to person, place, and time. She appears well-developed and well-nourished. No distress.  HENT:  Head: Normocephalic and atraumatic.  Nose: Nose normal.  Eyes: Conjunctivae and EOM are normal. Pupils are equal, round, and reactive to light. Right eye exhibits no discharge. Left eye exhibits no discharge. No scleral icterus.  Neck: Normal range of motion. Neck supple.  Cardiovascular: Normal rate and regular rhythm.  Exam reveals no gallop and no friction rub.   No murmur heard. Pulmonary/Chest: Effort normal and breath sounds normal. No stridor. No respiratory distress. She has no rales.  Abdominal: Soft. She exhibits no distension. There is no tenderness. There is no rigidity, no rebound and no guarding.  3 laparoscopic incision scars which looks clean, intact, healing well without evidence of erythema.  Musculoskeletal: She exhibits no edema or tenderness.  Neurological: She is alert and oriented to person, place, and time.  Skin: Skin is warm and dry. No rash noted. She is not diaphoretic. No erythema.  Psychiatric: Her mood appears anxious. Her affect is labile.  Vitals reviewed.    ED Treatments / Results  Labs (all labs ordered are listed, but only abnormal results are displayed) Labs Reviewed  CBC WITH DIFFERENTIAL/PLATELET - Abnormal; Notable for the following:       Result Value   WBC 13.8 (*)    MCV 76.8 (*)    MCH 25.4 (*)    Neutro Abs 10.6 (*)    All other components within normal limits  COMPREHENSIVE METABOLIC PANEL - Abnormal; Notable for the following:    Glucose, Bld 120 (*)    All other components within normal limits  I-STAT BETA HCG BLOOD, ED ([facility], [facility], [facility] ONLY)      EKG  EKG Interpretation  Date/Time:  Thursday May 18 2016 06:15:51 EST Ventricular Rate:  124 PR Interval:  136 QRS Duration: 92 QT Interval:  320 QTC Calculation: 459 R Axis:   35 Text Interpretation:  Sinus tachycardia Incomplete right bundle branch block Septal infarct , age undetermined Abnormal ECG No old tracing to compare Confirmed by [facility] MD, Ilyse Tremain ([id number]) on 05/18/2016 7:48:53 AM       Radiology No results found.  Procedures Procedures (including critical care time)  Medications Ordered in ED Medications  diazepam (VALIUM) tablet 2 mg (not administered)     Initial Impression / Assessment and Plan / ED Course  I have reviewed the triage vital signs and the nursing notes.  Pertinent labs & imaging results that were available during my care of the patient were reviewed by me and considered in my medical decision making (see chart for details).  Clinical Course     Presentation  is consistent with exacerbation of generalized anxiety. During my evaluation patient's heart rate is in the 80s and blood pressure 140s over 80s. The patient gets visibly anxious while discussing her symptoms and recent diagnoses. Triage screening labs did reveal a leukocytosis which could be explained by recent surgery and emotional stress. She denies any infectious symptoms. Exam is benign. Patient is able to be calmed down with therapeutic conversation.  We'll provide patient with low-dose of Valium until she is able to establish care with a psychiatrist next week.  The patient is safe for discharge with strict return precautions.  Final Clinical Impressions(s) / ED Diagnoses   Final diagnoses:  Palpitations  Anxiety   Disposition: Discharge  Condition: Good  I have discussed the results, Dx and Tx plan with the patient who expressed understanding and agree(s) with the plan. Discharge instructions discussed at great length. The patient was given strict return precautions who  verbalized understanding of the instructions. No further questions at time of discharge.    New Prescriptions   DIAZEPAM (VALIUM) 2 MG TABLET    Take 1 tablet (2 mg total) by mouth every 12 (twelve) hours as needed for anxiety.    Follow Up: Lorenda Ishihara, MD [address] [place] [address] [phone]  Schedule an appointment as soon as possible for a visit  As needed  Psychiatry   as scheduled        Nira Conn, MD 05/18/16 [phone]

## 2016-05-18 NOTE — ED Triage Notes (Signed)
Patient reports palpitations with emesis x2 onset yesterday , mild SOB , denies chest pain .

## 2016-10-20 ENCOUNTER — Emergency Department (HOSPITAL_COMMUNITY)
Admission: EM | Admit: 2016-10-20 | Discharge: 2016-10-21 | Disposition: A | Discharge status: Home / Self Care | Payer: BLUE CROSS/BLUE SHIELD | Attending: Emergency Medicine | Admitting: Emergency Medicine

## 2016-10-20 ENCOUNTER — Encounter (HOSPITAL_COMMUNITY): Payer: Self-pay | Admitting: Emergency Medicine

## 2016-10-20 DIAGNOSIS — S61351A Open bite of left index finger with damage to nail, initial encounter: Secondary | ICD-10-CM | POA: Insufficient documentation

## 2016-10-20 DIAGNOSIS — Y999 Unspecified external cause status: Secondary | ICD-10-CM | POA: Insufficient documentation

## 2016-10-20 DIAGNOSIS — W540XXA Bitten by dog, initial encounter: Secondary | ICD-10-CM | POA: Insufficient documentation

## 2016-10-20 DIAGNOSIS — Y929 Unspecified place or not applicable: Secondary | ICD-10-CM | POA: Insufficient documentation

## 2016-10-20 DIAGNOSIS — Y939 Activity, unspecified: Secondary | ICD-10-CM | POA: Insufficient documentation

## 2016-10-20 DIAGNOSIS — S61431A Puncture wound without foreign body of right hand, initial encounter: Secondary | ICD-10-CM | POA: Diagnosis not present

## 2016-10-20 NOTE — ED Provider Notes (Signed)
[facility] Provider Note   CSN: [id number] Arrival date & time: 10/20/16  2305     History   Chief Complaint Chief Complaint  Patient presents with  . Animal Bite    HPI Becky Williamson is a 31 y.o. female.  Patient with migraine, anxiety presents with complaint of animal bite to right hand and left index finger that occurred about 2 hours PTA. She was breaking up a fight between her pit bull and a dog she was fostering. No other injury. She complains of pain and swelling to multiple puncture wounds of right hand and a single bite through the nail of the left index finger.    The history is provided by the patient. No language interpreter was used.  Animal Bite  Associated symptoms: no fever and no numbness     Past Medical History:  Diagnosis Date  . Anxiety   . Complication of anesthesia    uncontrolled crying  . Depression   . Headache     Patient Active Problem List   Diagnosis Date Noted  . Vitamin D deficiency 06/27/2013  . Obesity, unspecified 06/27/2013  . Migraine 06/27/2013  . Premenstrual dysphoric disorder 06/27/2013  . TOA (tubo-ovarian abscess) 06/25/2013    Past Surgical History:  Procedure Laterality Date  . APPENDECTOMY  2011  . HYSTEROSCOPY W/D&C N/A 05/03/2016   Procedure: DILATATION AND CURETTAGE /HYSTEROSCOPY;  Surgeon: Geryl RankinsEvelyn Varnado, MD;  Location: [facility];  Service: Gynecology;  Laterality: N/A;  . LAPAROSCOPIC BILATERAL SALPINGECTOMY Bilateral 11/05/2014   Procedure: LAPAROSCOPIC BILATERAL SALPINGECTOMY;  Surgeon: Osborn CohoAngela Roberts, MD;  Location: [facility];  Service: Gynecology;  Laterality: Bilateral;  . LAPAROSCOPY N/A 05/03/2016   Procedure: LAPAROSCOPY DIAGNOSTIC WITH LYSIS OF ADHESIONS,DRAINAGE OF LEFT OVARIAN CYST, BIOPSY OF PELVIC SIDEWALL;  Surgeon: Geryl RankinsEvelyn Varnado, MD;  Location: [facility];  Service: Gynecology;  Laterality: N/A;  . WISDOM TOOTH EXTRACTION      OB History    No data available       Home Medications    Prior  to Admission medications   Medication Sig Start Date End Date Taking? Authorizing Provider  aspirin-acetaminophen-caffeine (EXCEDRIN MIGRAINE) [phone] MG per tablet Take 2 tablets by mouth every 6 (six) hours as needed for headache.    [provider]  FLUoxetine (PROZAC) 20 MG tablet Take 20 mg by mouth at bedtime.    [provider]  ibuprofen (ADVIL,MOTRIN) 800 MG tablet 1  PO  PC EVERY 6 HOURS FOR 5 DAYS THEN AS NEEDED FOR PAIN 05/03/16   Geryl RankinsVarnado, Evelyn, MD  Multiple Vitamin (MULTIVITAMIN WITH MINERALS) TABS tablet Take 1 tablet by mouth daily.    [provider]  oxyCODONE-acetaminophen (PERCOCET/ROXICET) 5-325 MG tablet Take 1-2 tablets by mouth every 6 (six) hours as needed for severe pain. 05/03/16   Geryl RankinsVarnado, Evelyn, MD  Probiotic Product (PROBIOTIC PO) Take 1 tablet by mouth daily.    [provider]    Family History Family History  Problem Relation Age of Onset  . Hypertension Mother   . Heart disease Mother   . Hypertension Father   . Heart disease Father     Social History Social History  Substance Use Topics  . Smoking status: Never Smoker  . Smokeless tobacco: Never Used  . Alcohol use Yes     Comment: social     Allergies   Other   Review of Systems Review of Systems  Constitutional: Negative for chills and fever.  Gastrointestinal: Positive for nausea.  Musculoskeletal:  See HPI.  Skin: Positive for wound.  Neurological: Negative.  Negative for weakness and numbness.     Physical Exam Updated Vital Signs BP 131/72 (BP Location: Left Arm)   Pulse (!) 107   Temp 97.8 F (36.6 C) (Oral)   Resp 18   Ht 5\' 7"  (1.702 m)   Wt 131.5 kg (290 lb)   SpO2 100%   BMI 45.42 kg/m   Physical Exam  Constitutional: She is oriented to person, place, and time. She appears well-developed and well-nourished.  Neck: Normal range of motion.  Pulmonary/Chest: Effort normal.  Musculoskeletal:  Right hand has hematomas  at the dorsal and proximal 2nd finger and the dorsal hand over the distal 3rd/4th MC. No bony deformity. FROM all digits and wrist. Moderately tender. Left hand has partial subungual hematoma and small laceration without nail avulsion. FROM DIP of finger.   Neurological: She is alert and oriented to person, place, and time.  Skin: Skin is warm and dry. Capillary refill takes less than 2 seconds.  Small puncture wounds associated with hand hematomas on right hand     ED Treatments / Results  Labs (all labs ordered are listed, but only abnormal results are displayed) Labs Reviewed - No data to display  EKG  EKG Interpretation None       Radiology No results found. Dg Hand Complete Right  Result Date: 10/21/2016 CLINICAL DATA:  Dog bite injury EXAM: RIGHT HAND - COMPLETE 3+ VIEW COMPARISON:  None. FINDINGS: There is no evidence of fracture or dislocation. There is no evidence of arthropathy or other focal bone abnormality. Dorsal soft tissue swelling. No radiopaque foreign body. IMPRESSION: No acute osseous abnormality Electronically Signed   By: Jasmine Pang M.D.   On: 10/21/2016 00:21   Dg Finger Index Left  Result Date: 10/21/2016 CLINICAL DATA:  Dog bite with bruising and swelling EXAM: LEFT INDEX FINGER 2+V COMPARISON:  None. FINDINGS: There is no evidence of fracture or dislocation. There is no evidence of arthropathy or other focal bone abnormality. Soft tissue swelling. IMPRESSION: No acute osseous abnormality Electronically Signed   By: Jasmine Pang M.D.   On: 10/21/2016 00:20   Procedures Procedures (including critical care time)  Medications Ordered in ED Medications - No data to display   Initial Impression / Assessment and Plan / ED Course  I have reviewed the triage vital signs and the nursing notes.  Pertinent labs & imaging results that were available during my care of the patient were reviewed by me and considered in my medical decision making (see chart for  details).     Patient presents with multiple puncture wounds after breaking up a fight between 2 dogs and getting bitten on both hands. No lacerations. Imaging is negative for fracture or foreign body. Will start on Augmentin. She declines pain medications. Tetanus is UTD. Dog bite from dog known to be vaccinated. Stable for discharge. Return precautions discussed.  Final Clinical Impressions(s) / ED Diagnoses   Final diagnoses:  None   1. Dog bite, bilateral hands  New Prescriptions New Prescriptions   No medications on file     Elpidio Anis, Cordelia Poche 10/21/16 0150    Ward, Layla Maw, DO 10/21/16 [phone]

## 2016-10-20 NOTE — ED Notes (Signed)
Patient transported to X-ray 

## 2016-10-20 NOTE — ED Triage Notes (Signed)
Pt was trying to break up fight between her dogs and was bit 3 times. Twice on R hand with moderate bruising and swelling. Once on L hand (pointer finger) with bleeding to nail.

## 2016-10-21 ENCOUNTER — Emergency Department (HOSPITAL_COMMUNITY): Payer: BLUE CROSS/BLUE SHIELD

## 2016-10-21 MED ORDER — BUSPIRONE HCL 10 MG PO TABS
15.0000 mg | ORAL_TABLET | Freq: Once | ORAL | Status: AC
  Administered 2016-10-21: 15 mg via ORAL
  Filled 2016-10-21: qty 2

## 2016-10-21 MED ORDER — FLUOXETINE HCL 20 MG PO CAPS
20.0000 mg | ORAL_CAPSULE | Freq: Every day | ORAL | Status: DC
  Administered 2016-10-21: 20 mg via ORAL
  Filled 2016-10-21: qty 1

## 2016-10-21 MED ORDER — AMOXICILLIN-POT CLAVULANATE 875-125 MG PO TABS: 1.0000 | ORAL_TABLET | Freq: Two times a day (BID) | ORAL | 0 refills | Status: AC

## 2018-11-15 IMAGING — CR DG FINGER INDEX 2+V*L*
3 series · 3 of 3 positions shown · non-contrast
Comparison: None.

CLINICAL DATA: Dog bite with bruising and swelling

EXAM:
LEFT INDEX FINGER 2+V

[finger ap]
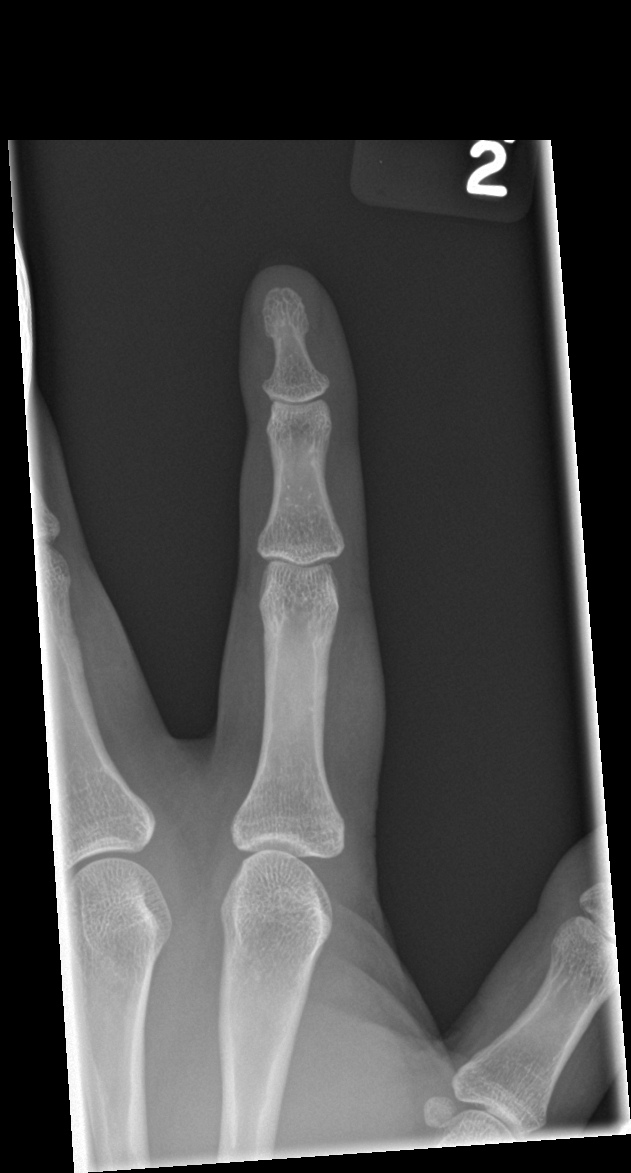

[finger obl]
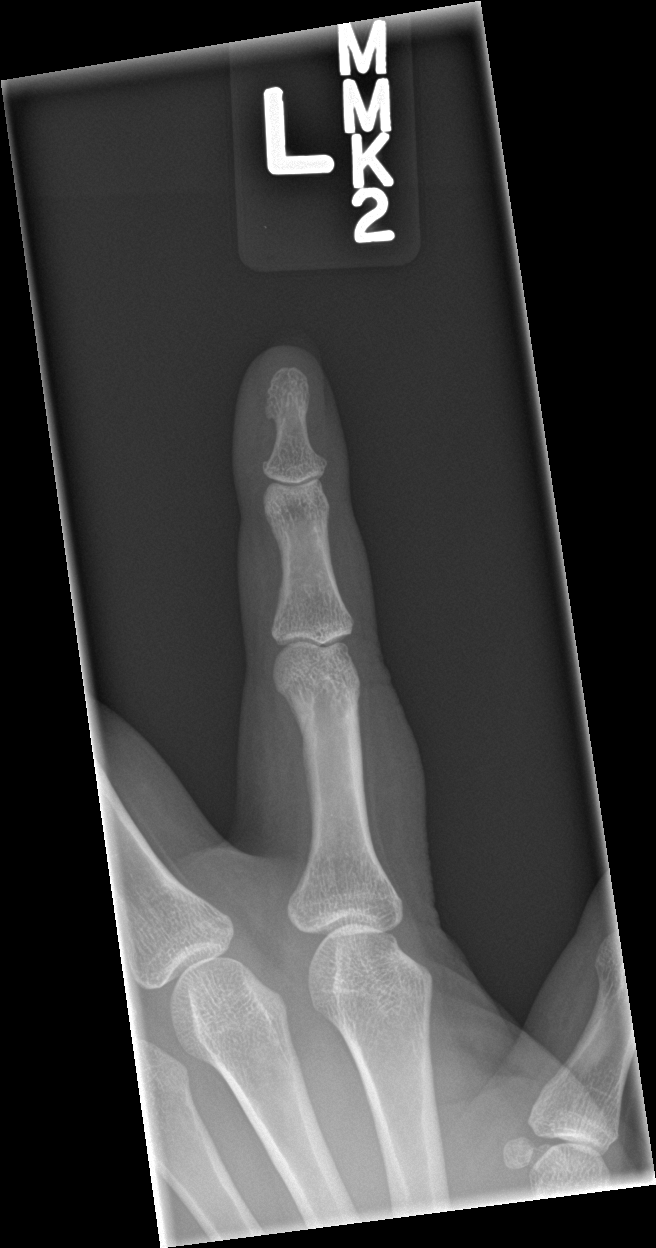

[finger lat]
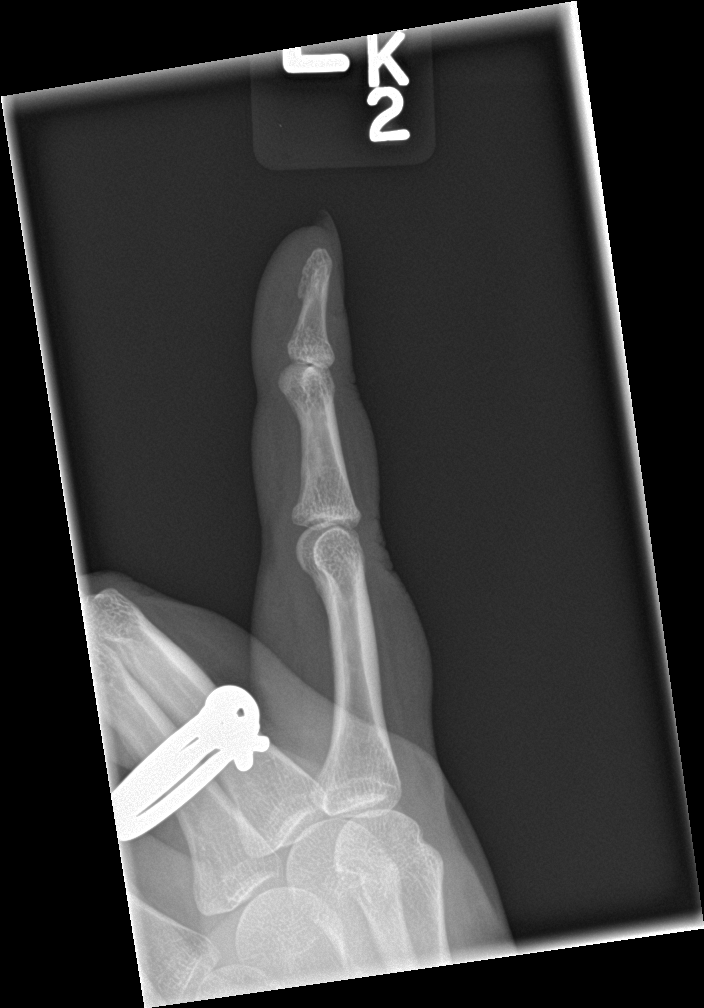

[3 of 3 positions shown; findings below may reference images not displayed]

FINDINGS: There is no evidence of fracture or dislocation. There is no
evidence of arthropathy or other focal bone abnormality. Soft tissue
swelling.
IMPRESSION: No acute osseous abnormality

## 2019-03-18 ENCOUNTER — Other Ambulatory Visit: Payer: Self-pay

## 2019-03-18 DIAGNOSIS — Z20822 Contact with and (suspected) exposure to covid-19: Secondary | ICD-10-CM

## 2019-03-19 LAB — NOVEL CORONAVIRUS, NAA: SARS-CoV-2, NAA: NOT DETECTED

## 2019-07-31 ENCOUNTER — Ambulatory Visit: Payer: BC Managed Care – PPO | Attending: Internal Medicine

## 2019-07-31 DIAGNOSIS — Z23 Encounter for immunization: Secondary | ICD-10-CM

## 2019-07-31 NOTE — Progress Notes (Signed)
   Covid-19 Vaccination Clinic  Name:  Rhema Boyett    MRN: [id number] DOB: Jun 18, 1985  07/31/2019  Ms. Criscione was observed post Covid-19 immunization for 15 minutes without incident. She was provided with Vaccine Information Sheet and instruction to access the V-Safe system.   Ms. Huss was instructed to call 911 with any severe reactions post vaccine: Marland Kitchen Difficulty breathing  . Swelling of face and throat  . A fast heartbeat  . A bad rash all over body  . Dizziness and weakness   Immunizations Administered    Name Date Dose VIS Date Route   Pfizer COVID-19 Vaccine 07/31/2019  3:43 PM 0.3 mL 04/25/2019 Intramuscular   Manufacturer: [employer], [employer]   Lot: [id number]   NDC: [address]-8

## 2019-08-25 ENCOUNTER — Ambulatory Visit: Payer: BC Managed Care – PPO | Attending: Internal Medicine

## 2019-08-25 DIAGNOSIS — Z23 Encounter for immunization: Secondary | ICD-10-CM

## 2019-08-25 NOTE — Progress Notes (Signed)
   Covid-19 Vaccination Clinic  Name:  Kiasha Bellin    MRN: [id number] DOB: 07/15/1985  08/25/2019  Ms. Junkins was observed post Covid-19 immunization for 15 minutes without incident. She was provided with Vaccine Information Sheet and instruction to access the V-Safe system.   Ms. Dibuono was instructed to call 911 with any severe reactions post vaccine: Marland Kitchen Difficulty breathing  . Swelling of face and throat  . A fast heartbeat  . A bad rash all over body  . Dizziness and weakness   Immunizations Administered    Name Date Dose VIS Date Route   Pfizer COVID-19 Vaccine 08/25/2019  4:07 PM 0.3 mL 04/25/2019 Intramuscular   Manufacturer: [employer], [employer]   Lot: [id number]   NDC: [address]-9
